# Patient Record
Sex: Male | Born: 1945
Health system: Southern US, Community
[De-identification: ages and names within clinical notes are randomized; demographics above are authoritative.]

## PROBLEM LIST (undated history)

## (undated) DIAGNOSIS — I214 Non-ST elevation (NSTEMI) myocardial infarction: Secondary | ICD-10-CM

## (undated) DIAGNOSIS — Z8619 Personal history of other infectious and parasitic diseases: Secondary | ICD-10-CM

## (undated) DIAGNOSIS — I2510 Atherosclerotic heart disease of native coronary artery without angina pectoris: Secondary | ICD-10-CM

## (undated) DIAGNOSIS — K5792 Diverticulitis of intestine, part unspecified, without perforation or abscess without bleeding: Secondary | ICD-10-CM

## (undated) DIAGNOSIS — K5790 Diverticulosis of intestine, part unspecified, without perforation or abscess without bleeding: Secondary | ICD-10-CM

## (undated) HISTORY — DX: Non-ST elevation (NSTEMI) myocardial infarction: I21.4

## (undated) HISTORY — PX: TONSILLECTOMY: SUR1361

## (undated) HISTORY — DX: Atherosclerotic heart disease of native coronary artery without angina pectoris: I25.10

## (undated) HISTORY — DX: Personal history of other infectious and parasitic diseases: Z86.19

## (undated) HISTORY — PX: HEMORRHOID SURGERY: SHX153

## (undated) HISTORY — PX: CATARACT EXTRACTION: SUR2

## (undated) HISTORY — DX: Diverticulitis of intestine, part unspecified, without perforation or abscess without bleeding: K57.92

---

## 2003-02-27 ENCOUNTER — Ambulatory Visit (HOSPITAL_COMMUNITY): Admission: RE | Admit: 2003-02-27 | Discharge: 2003-02-27 | Payer: Self-pay | Admitting: Cardiology

## 2010-08-28 ENCOUNTER — Ambulatory Visit
Admission: RE | Admit: 2010-08-28 | Discharge: 2010-08-28 | Payer: Self-pay | Source: Home / Self Care | Attending: Internal Medicine | Admitting: Internal Medicine

## 2011-01-02 NOTE — Cardiovascular Report (Signed)
Luke Herrera, PYON NO.:  [id number]   MEDICAL RECORD NO.:  [id number]                   PATIENT TYPE:  OIB   LOCATION:  2870                                 FACILITY:  [facility]   PHYSICIAN:  Arturo Morton. Riley Kill, M.D.             DATE OF BIRTH:  09/24/1945   DATE OF PROCEDURE:  02/27/2003  DATE OF DISCHARGE:                              CARDIAC CATHETERIZATION   INDICATIONS:  Mr. Lemoine is a pleasant 65 year old who has had some  recurrent episodes of chest discomfort and some decline in exercise  tolerance.  The current study is done to assess coronary anatomy.   PROCEDURES:  1. Left heart catheterization.  2. Selective coronary arteriography.  3. Selective left ventriculography.  4. Intercoronary administration of nitroglycerin.  5. Aortic root aortography.   DESCRIPTION OF PROCEDURE:  The patient was brought to the catheterization  lab and prepped and draped in the usual fashion.  Through an anterior  puncture, the right femoral artery was easily entered and a 6-French sheath  was placed.  Views of the left and right coronary arteries were obtained  with multiple angiographic projections.  There appeared to be some spasm in  the right coronary artery.  We gave nitroglycerin just outside the right  coronary ostium.  A narrowing in the vessel just after the RV branch was  completely relieved with nitroglycerin; however, there was still some  damping at the ostium.  We, therefore, did an aortic root shot, which  demonstrated what appeared to be a widely patent aortic root orifice for the  right coronary.  All catheters were subsequently removed and the patient  taken to the holding area in satisfactory clinical condition.   HEMODYNAMIC DATA:  1. The central aorta 130/68, mean 93.  2. Left ventricle 130/13.  3. No aortic or left ventricular gradient on pullback across the aortic     valve.   ANGIOGRAPHIC DATA:  1. Ventriculography was  performed in the RAO projection.  Overall systolic     function was well preserved and no segmental abnormalities or     contractions were identified.  2. The left main coronary was free of critical disease.  3. The left anterior descending appeared to have some mild luminal     irregularity after the takeoff of the major diagonal.  This did not     appear to exceed 20-30% luminal reduction.  Mid and distal LAD were     widely patent as was the diagonal.  4. The circumflex provides two marginal branches, both of which appear to be     free of critical disease.  5. The right coronary artery did demonstrate some coronary spasm as noted     just after the conus branch.  However, this was completely relieved.  The     distal vessel consists of a tiny posterior descending and posterior     lateral system and the  distal right coronary circulation was really     codominant with the circumflex system.   CONCLUSIONS:  1. Preserved overall left ventricular function.  2. Mild luminal irregularity left anterior descending as noted above.  3. Mild coronary spasm of the right coronary artery ostium relieved with     intracoronary nitroglycerin.   DISPOSITION:  1. Continued followup with Jonelle Sidle, M.D., and Dr. Doyne Keel.  2. Check D-dimer.  3. Further workup as indicated.                                               Arturo Morton. Riley Kill, M.D.    TDS/MEDQ  D:  02/27/2003  T:  02/27/2003  Job:  [id number]  Jonelle Sidle, M.D. [facility]   [name] Beck, M.D.   cc:   Jonelle Sidle, M.D. [facility]   [name] Beck, M.D.

## 2011-07-02 ENCOUNTER — Telehealth (INDEPENDENT_AMBULATORY_CARE_PROVIDER_SITE_OTHER): Payer: Self-pay | Admitting: Internal Medicine

## 2011-07-02 NOTE — Telephone Encounter (Signed)
It sounds like he is having a diverticular flare. His last flare was last year. He underwent a colonoscopy which revealed focal colitis involving the sigmoid colon and rectum.  and Multiple diverticula at the sigmoid colon. Biopsy revealed acute colitis, non specific.  Recommendations: Will call in an Rx for Cipro 500mg  BID x 10 days and Flagyl 500mg  po TID x 10 with one refill.   I advised him if pain worsened to call our office. We close at 12 noon on Friday. If he begans to have rectal bleeding, ne should go to the emergency dept.    He will have an office visit with me next week.   Lupita Leash:  He needs an OV with me next week.

## 2011-07-02 NOTE — Telephone Encounter (Signed)
Scheduled a 1 week f/u apt for Diverticulitis on 07/08/11 at 10:30 am.

## 2011-07-02 NOTE — Telephone Encounter (Signed)
Having trouble with his Diverticulitis. Side and stomach is hurting, trouble going to the bathroom and bleeding right smart. Pain level varies. Please return the call to [phone] or [phone].

## 2011-07-08 ENCOUNTER — Ambulatory Visit (INDEPENDENT_AMBULATORY_CARE_PROVIDER_SITE_OTHER): Payer: Medicare Other | Admitting: Internal Medicine

## 2011-07-08 ENCOUNTER — Encounter (INDEPENDENT_AMBULATORY_CARE_PROVIDER_SITE_OTHER): Payer: Self-pay | Admitting: Internal Medicine

## 2011-07-08 VITALS — BP 90/64 | HR 76 | Temp 98.1°F | Ht 72.0 in | Wt 199.4 lb

## 2011-07-08 DIAGNOSIS — K5792 Diverticulitis of intestine, part unspecified, without perforation or abscess without bleeding: Secondary | ICD-10-CM | POA: Insufficient documentation

## 2011-07-08 DIAGNOSIS — K5732 Diverticulitis of large intestine without perforation or abscess without bleeding: Secondary | ICD-10-CM

## 2011-07-08 NOTE — Patient Instructions (Signed)
Progress report once you have finished antibiotics x 14 days.  If pain worsens he was advised to go to the emergency room

## 2011-07-08 NOTE — Progress Notes (Signed)
Subjective:     Patient ID: Luke Herrera, male   DOB: 24-Sep-1945, 65 y.o.   MRN: [id number]  HPI  Luke Herrera is a 65 yr old male here to today.  He called our office last week stating he felt he was having a diverticular flare.  He tells me he doing pretty good. He is not having any rectal bleeding. He still has slight left lower quadrant pain.  His appetite is good.  He has been eating soft foods.  Colonoscopy 03/31/2010:Multiple diverticula at the sigmoid colon. Focal colitis involving sigmoid colon and rectum with pseudomembrane suspicious for C. Difficile colitis. Small external hemorrhoids.  Biopsy:Acute colitis, non specific. CT abdomen/pelvis with CM 02/10/2010: Findings consistent with left sided diverticulosis and diverticulitis. No evidence of perforation, abscess, or free air. Lumbar degenerative disc disease.  Review of Systems see hpi Current Outpatient Prescriptions  Medication Sig Dispense Refill  . aspirin 325 MG tablet Take 325 mg by mouth daily.         Past Surgical History  Procedure Date  . Tonsillectomy   . Hemorrhoid surgery    Past Medical History  Diagnosis Date  . Diverticulitis    History   Social History Narrative  . No narrative on file   Family Status  Relation Status Death Age  . Mother Alive     age 66  . Father Deceased     murdered  . Brother Alive     health unknown   Allergies no known allergies     Objective:   Physical Exam Filed Vitals:   07/08/11 1041  BP: 90/64  Pulse: 76  Temp: 98.1 F (36.7 C)  Height: 6' (1.829 m)  Weight: 199 lb 6.4 oz (90.447 kg)    Alert and oriented. Skin warm and dry. Oral mucosa is moist. Natural teeth in good condition. Sclera anicteric, conjunctivae is pink. Thyroid not enlarged. No cervical lymphadenopathy. Lungs clear. Heart regular rate and rhythm.  Abdomen is soft. Bowel sounds are positive. No hepatomegaly. No abdominal masses felt. No tenderness.  No edema to lower extremities. Patient is  alert and oriented.      Assessment:     Diverticulitis.  He feels much better today. He has some left lower quadrant tenderness.      Plan:     Continue Cipro and Flagyl for four more days (14 days).  Progress report when you have finished antibiotics. If symptoms worsen, go to the ED.

## 2011-07-20 ENCOUNTER — Telehealth (INDEPENDENT_AMBULATORY_CARE_PROVIDER_SITE_OTHER): Payer: Self-pay | Admitting: *Deleted

## 2011-07-20 NOTE — Telephone Encounter (Signed)
Just called to let Luke Herrera know he is doing good.

## 2011-08-24 ENCOUNTER — Telehealth (INDEPENDENT_AMBULATORY_CARE_PROVIDER_SITE_OTHER): Payer: Self-pay | Admitting: *Deleted

## 2011-08-24 NOTE — Telephone Encounter (Signed)
LM asking Terri to please give him a call. The return phone number is [phone].

## 2011-08-24 NOTE — Telephone Encounter (Signed)
Message left at home for patient to return call.  

## 2011-09-01 ENCOUNTER — Encounter (INDEPENDENT_AMBULATORY_CARE_PROVIDER_SITE_OTHER): Payer: Self-pay

## 2011-09-01 ENCOUNTER — Encounter (INDEPENDENT_AMBULATORY_CARE_PROVIDER_SITE_OTHER): Payer: Self-pay | Admitting: Internal Medicine

## 2011-09-01 ENCOUNTER — Ambulatory Visit (INDEPENDENT_AMBULATORY_CARE_PROVIDER_SITE_OTHER): Payer: Medicare Other | Admitting: Internal Medicine

## 2011-09-01 DIAGNOSIS — K625 Hemorrhage of anus and rectum: Secondary | ICD-10-CM | POA: Diagnosis not present

## 2011-09-01 DIAGNOSIS — K5730 Diverticulosis of large intestine without perforation or abscess without bleeding: Secondary | ICD-10-CM | POA: Diagnosis not present

## 2011-09-01 DIAGNOSIS — K5790 Diverticulosis of intestine, part unspecified, without perforation or abscess without bleeding: Secondary | ICD-10-CM | POA: Insufficient documentation

## 2011-09-01 MED ORDER — CIPROFLOXACIN HCL 500 MG PO TABS: 500.0000 mg | ORAL_TABLET | Freq: Two times a day (BID) | ORAL | Status: AC

## 2011-09-01 NOTE — Progress Notes (Addendum)
Subjective:     Patient ID: Luke Herrera, male   DOB: 1946-06-27, 66 y.o.   MRN: [id number]  HPI  Luke Herrera is a 66 yr old male with c/o that his rectum is raw and he has bleeding.  Symptoms started Friday. He has tried Preparation H but has not helped.  He says the bleeding is a fair amount.  He denies abdominal pain. His appetite is good.   Colonoscopy 03/31/2010: Multiple diverticula at the sigmoid colon. Focal colitis involving sigmoid colon and rectum with pseudomembranous for C. Difficile. Small external hemorrhoids. 02/10/2010 CT abdomen/pelvis with CM: Findings consistent with left sided diverticulosis and diverticulitis. No evidence of perforation, abscess, or free air. Lumbar degenerative disc disease.  Review of Systems     Objective:   Physical ExamAlert and oriented. Skin warm and dry. Oral mucosa is moist.   . Sclera anicteric, conjunctivae is pink. Thyroid not enlarged. No cervical lymphadenopathy. Lungs clear. Heart regular rate and rhythm.  Abdomen is soft. Bowel sounds are positive. No hepatomegaly. No abdominal masses felt. No tenderness. Rectal exam: very tender for the patient. Stool was positive for blood.   No edema to lower extremities. Patient is alert and oriented.     Assessment:    Rectal bleeding? Questionable rectal fissure.   I discussed this case with Dr. Karilyn Herrera   Plan:    Will try Luke Herrera on  Nitroglycerin oint 0.2%, and Cipro 500mg  BID x 7 days. PR in 3 days. OV in 6 months

## 2011-09-01 NOTE — Patient Instructions (Signed)
PR in 3 days.  Possible sigmoid if not better

## 2011-09-08 ENCOUNTER — Telehealth (INDEPENDENT_AMBULATORY_CARE_PROVIDER_SITE_OTHER): Payer: Self-pay | Admitting: *Deleted

## 2011-09-08 NOTE — Telephone Encounter (Signed)
LM for Luke Herrera - he is some better than he was before he seen Terri. Patient's return phone number is [phone].

## 2011-09-08 NOTE — Telephone Encounter (Signed)
Message left at home to return call.

## 2011-09-11 NOTE — Telephone Encounter (Signed)
Patient came by office and sated he felt better per Lupita Leash.

## 2011-10-14 ENCOUNTER — Encounter (INDEPENDENT_AMBULATORY_CARE_PROVIDER_SITE_OTHER): Payer: Self-pay | Admitting: Internal Medicine

## 2011-10-14 ENCOUNTER — Other Ambulatory Visit (INDEPENDENT_AMBULATORY_CARE_PROVIDER_SITE_OTHER): Payer: Self-pay | Admitting: *Deleted

## 2011-10-14 ENCOUNTER — Ambulatory Visit (INDEPENDENT_AMBULATORY_CARE_PROVIDER_SITE_OTHER): Payer: Medicare Other | Admitting: Internal Medicine

## 2011-10-14 ENCOUNTER — Encounter (INDEPENDENT_AMBULATORY_CARE_PROVIDER_SITE_OTHER): Payer: Self-pay | Admitting: *Deleted

## 2011-10-14 DIAGNOSIS — K6289 Other specified diseases of anus and rectum: Secondary | ICD-10-CM

## 2011-10-14 DIAGNOSIS — K5289 Other specified noninfective gastroenteritis and colitis: Secondary | ICD-10-CM | POA: Diagnosis not present

## 2011-10-14 DIAGNOSIS — K529 Noninfective gastroenteritis and colitis, unspecified: Secondary | ICD-10-CM

## 2011-10-14 NOTE — Patient Instructions (Signed)
Colonoscopy with Dr. Rehman 

## 2011-10-14 NOTE — Progress Notes (Signed)
Subjective:     Patient ID: Luke Herrera, male   DOB: [birth year], 66 y.o.   MRN: [id number]  HPI Luke Herrera is a 66 yr old male with c/o left lower quadrant pain. He says when he has a BM, it burns like fire.  This has been occuring for a year and a half off and on.  Stools are soft.  He has a burning sensation in his rectum with his BM.  He does see blood on occasion. Appetite is good. No weight loss. He has 3-4 BMs daily.   He was seen in January of this year for this same pain and given an Rx for NTG ointment.  Symptoms did not get any better evidently.   Colonoscopy 03/31/2010: Multiple diverticula at the sigmoid colon. Focal colitis involving sigmoid colon and rectum with pseudomembranous for C. Difficile. Biopsy: acute colitis, non specific. Small external hemorrhoids.  02/10/2010 CT abdomen/pelvis with CM: Findings consistent with left sided diverticulosis and diverticulitis. No evidence of perforation, abscess, or free air. Lumbar degenerative disc disease.      Review of Systems see hpi Current Outpatient Prescriptions  Medication Sig Dispense Refill  . aspirin 325 MG tablet Take 325 mg by mouth 2 (two) times a week.       . Flaxseed, Linseed, (FLAX SEED OIL PO) Take by mouth.      . OIL OF OREGANO PO Take 2 drops by mouth once.      . Probiotic Product (PROBIOTIC ACIDOPHILUS) CAPS Take 1 tablet by mouth 1 day or 1 dose.      Jennette Banker Sodium 30-100 MG CAPS Take by mouth.       Past Medical History  Diagnosis Date  . Diverticulitis    Past Surgical History  Procedure Date  . Tonsillectomy   . Hemorrhoid surgery    Family Status  Relation Status Death Age  . Mother Alive     age 19  . Father Deceased     murdered  . Brother Alive     health unknown   History   Social History  . Marital Status: Single    Spouse Name: N/A    Number of Children: N/A  . Years of Education: N/A   Occupational History  . Not on file.   Social History Main Topics  .  Smoking status: Never Smoker   . Smokeless tobacco: Not on file  . Alcohol Use: No  . Drug Use: No  . Sexually Active: Not on file   Other Topics Concern  . Not on file   Social History Narrative  . No narrative on file   No Known Allergies      Objective:   Physical Exam Filed Vitals:   10/14/11 1024  BP: 112/60  Pulse: 72  Temp: 98.1 F (36.7 C)  Height: 5\' 11"  (1.803 m)  Weight: 191 lb 14.4 oz (87.045 kg)    Alert and oriented. Skin warm and dry. Oral mucosa is moist.   . Sclera anicteric, conjunctivae is pink. Thyroid not enlarged. No cervical lymphadenopathy. Lungs clear. Heart regular rate and rhythm.  Abdomen is soft. Bowel sounds are positive. No hepatomegaly. No abdominal masses felt. No tenderness.  No edema to lower extremities. Patient is alert and oriented.      Assessment:   Rectal pain, colitis, rt lower quadrant pain. Hx of diverticulitis Colonoscopy 03/31/2010: Multiple diverticula at the sigmoid colon. Focal colitis involving sigmoid colon and rectum with pseudomembranous for C. Difficile. Small external  hemorrhoids.  02/10/2010 CT abdomen/pelvis with CM: Findings consistent with left sided diverticulosis and diverticulitis. No evidence of perforation, abscess, or free air. Lumbar degenerative disc disease.       Plan:    Flexible sigmoid. I discussed this case with Dr. Karilyn Cota.

## 2011-10-15 MED ORDER — SODIUM CHLORIDE 0.45 % IV SOLN
Freq: Once | INTRAVENOUS | Status: AC
  Administered 2011-10-16: 07:00:00 via INTRAVENOUS

## 2011-10-16 ENCOUNTER — Encounter (HOSPITAL_COMMUNITY)
Admission: RE | Disposition: A | Discharge status: Home / Self Care | Payer: Self-pay | Source: Ambulatory Visit | Attending: Internal Medicine

## 2011-10-16 ENCOUNTER — Encounter (HOSPITAL_COMMUNITY): Payer: Self-pay

## 2011-10-16 ENCOUNTER — Ambulatory Visit (HOSPITAL_COMMUNITY)
Admission: RE | Admit: 2011-10-16 | Discharge: 2011-10-16 | Disposition: A | Discharge status: Home / Self Care | Payer: Medicare Other | Source: Ambulatory Visit | Attending: Internal Medicine | Admitting: Internal Medicine

## 2011-10-16 DIAGNOSIS — R1032 Left lower quadrant pain: Secondary | ICD-10-CM

## 2011-10-16 DIAGNOSIS — R198 Other specified symptoms and signs involving the digestive system and abdomen: Secondary | ICD-10-CM

## 2011-10-16 DIAGNOSIS — K602 Anal fissure, unspecified: Secondary | ICD-10-CM

## 2011-10-16 DIAGNOSIS — K529 Noninfective gastroenteritis and colitis, unspecified: Secondary | ICD-10-CM

## 2011-10-16 DIAGNOSIS — K626 Ulcer of anus and rectum: Secondary | ICD-10-CM | POA: Diagnosis not present

## 2011-10-16 DIAGNOSIS — Z8719 Personal history of other diseases of the digestive system: Secondary | ICD-10-CM

## 2011-10-16 DIAGNOSIS — K6289 Other specified diseases of anus and rectum: Secondary | ICD-10-CM

## 2011-10-16 DIAGNOSIS — K5730 Diverticulosis of large intestine without perforation or abscess without bleeding: Secondary | ICD-10-CM | POA: Insufficient documentation

## 2011-10-16 HISTORY — PX: FLEXIBLE SIGMOIDOSCOPY: SHX5431

## 2011-10-16 HISTORY — DX: Diverticulosis of intestine, part unspecified, without perforation or abscess without bleeding: K57.90

## 2011-10-16 SURGERY — SIGMOIDOSCOPY, FLEXIBLE
Anesthesia: Moderate Sedation

## 2011-10-16 MED ORDER — MEPERIDINE HCL 25 MG/ML IJ SOLN
INTRAMUSCULAR | Status: DC | PRN
  Administered 2011-10-16 (×2): 25 mg via INTRAVENOUS

## 2011-10-16 MED ORDER — MIDAZOLAM HCL 5 MG/5ML IJ SOLN
INTRAMUSCULAR | Status: AC
  Filled 2011-10-16: qty 10

## 2011-10-16 MED ORDER — MIDAZOLAM HCL 5 MG/5ML IJ SOLN
INTRAMUSCULAR | Status: DC | PRN
  Administered 2011-10-16 (×2): 2 mg via INTRAVENOUS

## 2011-10-16 MED ORDER — NITROGLYCERIN 0.4 % RE OINT: 1.0000 "application " | TOPICAL_OINTMENT | Freq: Two times a day (BID) | RECTAL | Status: DC

## 2011-10-16 MED ORDER — STERILE WATER FOR IRRIGATION IR SOLN
Status: DC | PRN
  Administered 2011-10-16: 07:00:00

## 2011-10-16 MED ORDER — MEPERIDINE HCL 50 MG/ML IJ SOLN
INTRAMUSCULAR | Status: AC
  Filled 2011-10-16: qty 1

## 2011-10-16 NOTE — Op Note (Signed)
FLEXIBLE SIGMOIDOSCOPY  PROCEDURE REPORT  PATIENT:  Luke Herrera  MR#:  [id number] Birthdate:  July 26, 1946, 66 y.o., male Endoscopist:  Dr. Malissa Hippo, MD Referred By:  Ms. Bennie Pierini, NP  Procedure Date: 10/16/2011  Procedure:  Flexible sigmoidoscopy.  Indications:  Patient is 66 year old Caucasian male with a recurrent LLQ abdominal pain as well as burning rectal pain with defecation. He is undergoing diagnostic flexible sigmoidoscopy. He has history of focal colitis as noted in history and physical.  Informed Consent:   Procedure and risks were reviewed with the patient and informed consent was obtained.  Medications:  Demerol 50 mg IV Versed 4 mg IV  Description of procedure:  After a digital rectal exam was performed, that colonoscope was advanced from the anus through the rectum and advanced into the splenic flexure where he  had formed stool. As the scope was gradually withdrawn the mucosa was carefully examined. Rectal mucosa was also examined as a last retroflex exam was performed. Findings:   Digital rectal exam was very painful with increased tone to anal sphincter. Moderate number of diverticula in sigmoid colon with few at descending colon. Erythematous nodular area next to the dentate line. It was biopsied for histology. Linear ulcer noted involving proximal aspect of anal canal.   Therapeutic/Diagnostic Maneuvers Performed:  See above.  Complications:  None    Impression:  Examination performed to splenic flexure. Moderate number of diverticula at the sigmoid colon and few at descending colon. No evidence of endoscopic colitis. Focal nodular area of distal rectum biopsy for routine histology. Patient has anal fissure.   Recommendations:  Nitroglycerin ointment 0.2% topical application of anal canal twice a day. Office visit in 4 weeks. I would be contacting patient with the result of biopsy. If rectal symptoms do not resolve with  nitroglycerin ointment he will need internal sphincterotomy.  Curtistine Pettitt U  10/16/2011 7:59 AM  CC: Dr. Bennie Pierini, FNP, FNP & Dr. Bonnetta Barry ref. provider found

## 2011-10-16 NOTE — Discharge Instructions (Signed)
Resume usual medications and high fiber diet. Nitroglycerin 0.4% lidocaine canal twice daily. No driving for [id number]. Physician we'll contact you with the biopsy results. Office visit with Dr. Karilyn Cota in 4 weeksDiverticulosis Diverticulosis is a common condition that develops when small pouches (diverticula) form in the wall of the colon. The risk of diverticulosis increases with age. It happens more often in people who eat a low-fiber diet. Most individuals with diverticulosis have no symptoms. Those individuals with symptoms usually experience abdominal pain, constipation, or loose stools (diarrhea). HOME CARE INSTRUCTIONS   Increase the amount of fiber in your diet as directed by your caregiver or dietician. This may reduce symptoms of diverticulosis.   Your caregiver may recommend taking a dietary fiber supplement.   Drink at least 6 to 8 glasses of water each day to prevent constipation.   Try not to strain when you have a bowel movement.   Your caregiver may recommend avoiding nuts and seeds to prevent complications, although this is still an uncertain benefit.   Only take over-the-counter or prescription medicines for pain, discomfort, or fever as directed by your caregiver.  FOODS WITH HIGH FIBER CONTENT INCLUDE:  Fruits. Apple, peach, pear, tangerine, raisins, prunes.   Vegetables. Brussels sprouts, asparagus, broccoli, cabbage, carrot, cauliflower, romaine lettuce, spinach, summer squash, tomato, winter squash, zucchini.   Starchy Vegetables. Baked beans, kidney beans, lima beans, split peas, lentils, potatoes (with skin).   Grains. Whole wheat bread, brown rice, bran flake cereal, plain oatmeal, white rice, shredded wheat, bran muffins.  SEEK IMMEDIATE MEDICAL CARE IF:   You develop increasing pain or severe bloating.   You have an oral temperature above 102 F (38.9 C), not controlled by medicine.   You develop vomiting or bowel movements that are bloody or black.    Document Released: 04/30/2004 Document Revised: 04/15/2011 Document Reviewed: 01/01/2010 [facility] Patient Information 2012 [place], [place].High Fiber Diet A high fiber diet changes your normal diet to include more whole grains, legumes, fruits, and vegetables. Changes in the diet involve replacing refined carbohydrates with unrefined foods. The calorie level of the diet is essentially unchanged. The Dietary Reference Intake (recommended amount) for adult males is 38 g per day. For adult females, it is 25 g per day. Pregnant and lactating women should consume 28 g of fiber per day. Fiber is the intact part of a plant that is not broken down during digestion. Functional fiber is fiber that has been isolated from the plant to provide a beneficial effect in the body. PURPOSE  Increase stool bulk.   Ease and regulate bowel movements.   Lower cholesterol.  INDICATIONS THAT YOU NEED MORE FIBER  Constipation and hemorrhoids.   Uncomplicated diverticulosis (intestine condition) and irritable bowel syndrome.   Weight management.   As a protective measure against hardening of the arteries (atherosclerosis), diabetes, and cancer.  NOTE OF CAUTION If you have a digestive or bowel problem, ask your caregiver for advice before adding high fiber foods to your diet. Some of the following medical problems are such that a high fiber diet should not be used without consulting your caregiver:  Acute diverticulitis (intestine infection).   Partial small bowel obstructions.   Complicated diverticular disease involving bleeding, rupture (perforation), or abscess (boil, furuncle).   Presence of autonomic neuropathy (nerve damage) or gastric paresis (stomach cannot empty itself).  GUIDELINES FOR INCREASING FIBER  Start adding fiber to the diet slowly. A gradual increase of about 5 more grams (2 slices of whole-wheat bread, 2 servings  of most fruits or vegetables, or 1 bowl of high fiber cereal) per day is  best. Too rapid an increase in fiber may result in constipation, flatulence, and bloating.   Drink enough water and fluids to keep your urine clear or pale yellow. Water, juice, or caffeine-free drinks are recommended. Not drinking enough fluid may cause constipation.   Eat a variety of high fiber foods rather than one type of fiber.   Try to increase your intake of fiber through using high fiber foods rather than fiber pills or supplements that contain small amounts of fiber.   The goal is to change the types of food eaten. Do not supplement your present diet with high fiber foods, but replace foods in your present diet.  INCLUDE A VARIETY OF FIBER SOURCES  Replace refined and processed grains with whole grains, canned fruits with fresh fruits, and incorporate other fiber sources. White rice, white breads, and most bakery goods contain little or no fiber.   Brown whole-grain rice, buckwheat oats, and many fruits and vegetables are all good sources of fiber. These include: broccoli, Brussels sprouts, cabbage, cauliflower, beets, sweet potatoes, white potatoes (skin on), carrots, tomatoes, eggplant, squash, berries, fresh fruits, and dried fruits.   Cereals appear to be the richest source of fiber. Cereal fiber is found in whole grains and bran. Bran is the fiber-rich outer coat of cereal grain, which is largely removed in refining. In whole-grain cereals, the bran remains. In breakfast cereals, the largest amount of fiber is found in those with "bran" in their names. The fiber content is sometimes indicated on the label.   You may need to include additional fruits and vegetables each day.   In baking, for 1 cup white flour, you may use the following substitutions:   1 cup whole-wheat flour minus 2 tbs.    cup white flour plus  cup whole-wheat flour.  Document Released: 08/03/2005 Document Revised: 04/15/2011 Document Reviewed: 06/11/2009 [facility] Patient Information 2012 [place],  [place].Colon Polyps A polyp is extra tissue that grows inside your body. Colon polyps grow in the large intestine. The large intestine, also called the colon, is part of your digestive system. It is a long, hollow tube at the end of your digestive tract where your body makes and stores stool. Most polyps are not dangerous. They are benign. This means they are not cancerous. But over time, some types of polyps can turn into cancer. Polyps that are smaller than a pea are usually not harmful. But larger polyps could someday become or may already be cancerous. To be safe, doctors remove all polyps and test them.  WHO GETS POLYPS? Anyone can get polyps, but certain people are more likely than others. You may have a greater chance of getting polyps if:  You are over 50.   You have had polyps before.   Someone in your family has had polyps.   Someone in your family has had cancer of the large intestine.   Find out if someone in your family has had polyps. You may also be more likely to get polyps if you:   Eat a lot of fatty foods.   Smoke.   Drink alcohol.   Do not exercise.   Eat too much.  SYMPTOMS  Most small polyps do not cause symptoms. People often do not know they have one until their caregiver finds it during a regular checkup or while testing them for something else. Some people do have symptoms like these:  Bleeding from the anus. You might notice blood on your underwear or on toilet paper after you have had a bowel movement.   Constipation or diarrhea that lasts more than a week.   Blood in the stool. Blood can make stool look black or it can show up as red streaks in the stool.  If you have any of these symptoms, see your caregiver. HOW DOES THE DOCTOR TEST FOR POLYPS? The doctor can use four tests to check for polyps:  Digital rectal exam. The caregiver wears gloves and checks your rectum (the last part of the large intestine) to see if it feels normal. This test would find  polyps only in the rectum. Your caregiver may need to do one of the other tests listed below to find polyps higher up in the intestine.   Barium enema. The caregiver puts a liquid called barium into your rectum before taking x-rays of your large intestine. Barium makes your intestine look white in the pictures. Polyps are dark, so they are easy to see.   Sigmoidoscopy. With this test, the caregiver can see inside your large intestine. A thin flexible tube is placed into your rectum. The device is called a sigmoidoscope, which has a light and a tiny video camera in it. The caregiver uses the sigmoidoscope to look at the last third of your large intestine.   Colonoscopy. This test is like sigmoidoscopy, but the caregiver looks at all of the large intestine. It usually requires sedation. This is the most common method for finding and removing polyps.  TREATMENT   The caregiver will remove the polyp during sigmoidoscopy or colonoscopy. The polyp is then tested for cancer.   If you have had polyps, your caregiver may want you to get tested regularly in the future.  PREVENTION  There is not one sure way to prevent polyps. You might be able to lower your risk of getting them if you:  Eat more fruits and vegetables and less fatty food.   Do not smoke.   Avoid alcohol.   Exercise every day.   Lose weight if you are overweight.   Eating more calcium and folate can also lower your risk of getting polyps. Some foods that are rich in calcium are milk, cheese, and broccoli. Some foods that are rich in folate are chickpeas, kidney beans, and spinach.   Aspirin might help prevent polyps. Studies are under way.  Document Released: 04/29/2004 Document Revised: 04/15/2011 Document Reviewed: 10/05/2007 [facility] Patient Information 2012 [place], [place].

## 2011-10-16 NOTE — H&P (Signed)
This is an update to history and physical from 10/14/2011. There has been no interval change in patient's condition or medications. He is undergoing diagnostic flexible sigmoidoscopy

## 2011-10-21 ENCOUNTER — Encounter (HOSPITAL_COMMUNITY): Payer: Self-pay | Admitting: Internal Medicine

## 2011-10-21 ENCOUNTER — Encounter (INDEPENDENT_AMBULATORY_CARE_PROVIDER_SITE_OTHER): Payer: Self-pay | Admitting: *Deleted

## 2011-11-09 ENCOUNTER — Ambulatory Visit (INDEPENDENT_AMBULATORY_CARE_PROVIDER_SITE_OTHER): Payer: Medicare Other | Admitting: Internal Medicine

## 2011-11-09 ENCOUNTER — Encounter (INDEPENDENT_AMBULATORY_CARE_PROVIDER_SITE_OTHER): Payer: Self-pay | Admitting: Internal Medicine

## 2011-11-09 VITALS — BP 130/70 | HR 76 | Temp 98.0°F | Resp 18 | Ht 71.0 in | Wt 193.9 lb

## 2011-11-09 DIAGNOSIS — K602 Anal fissure, unspecified: Secondary | ICD-10-CM

## 2011-11-09 HISTORY — DX: Anal fissure, unspecified: K60.2

## 2011-11-09 MED ORDER — NITROGLYCERIN 0.4 % RE OINT: 1.0000 "application " | TOPICAL_OINTMENT | Freq: Two times a day (BID) | RECTAL | Status: DC

## 2011-11-09 MED ORDER — SENNOSIDES-DOCUSATE SODIUM 8.6-50 MG PO TABS: 1.0000 | ORAL_TABLET | Freq: Two times a day (BID) | ORAL | Status: AC

## 2011-11-09 NOTE — Progress Notes (Signed)
Presenting complaint;  Followup for anal fissure.  Subjective:  Luke Herrera is 66 year old Caucasian male who underwent flexible sigmoidoscopy on 10/16/2011 because of rectal burning and painful defecation. He had left-sided colonic diverticulosis, anal fissure and nodularity just above the dentate line. This area was biopsied and revealed surface erosion or ulceration with lymphoid aggregates. Patient was begun on nitroglycerin ointment. He may have been using 0.4%. He says it helped with her symptoms but he's been having headache. He hasn't used it for the last few days. He is having intermittent hematochezia. Having painful defecation but nothing like before. His stools are soft but he has to strain to initiate defecation. His appetite is good. He denies nausea or vomiting.  Current Medications: Current Outpatient Prescriptions  Medication Sig Dispense Refill  . aspirin 325 MG tablet Take 325 mg by mouth 2 (two) times a week.       . Flaxseed, Linseed, (FLAX SEED OIL PO) Take by mouth.      . Probiotic Product (PROBIOTIC ACIDOPHILUS) CAPS Take 1 tablet by mouth 1 day or 1 dose.      Marland Kitchen Zinc 100 MG TABS Take by mouth daily.      . Nitroglycerin 0.4 % OINT Place 1 application rectally 2 (two) times daily.  1 Tube  2     Objective: Blood pressure 130/70, pulse 76, temperature 98 F (36.7 C), temperature source Oral, resp. rate 18, height 5\' 11"  (1.803 m), weight 193 lb 14.4 oz (87.952 kg). Patient does not appear to be in any distress. Abdomen is symmetrical and soft. Mild tenderness noted in hypogastric region. No organomegaly or masses. Rectal examination Limited to external inspection and palpation no nodularity or tenderness noted.  Assessment:  Fissure in ano. Partial response to topical therapy with Nitro ointment which she has been using intermittently because of headache. He may want to decrease the dose his wife Luke Herrera(RN) Will make sure that it is 0.2%  ointment.   Plan:  Peri-Colace one tablet by mouth twice a day. Use nitroglycerin ointment at least once a day. Progress report in 4 weeks. If his symptoms are not fully resolved need examination under anesthesia and possible internal sphincterotomy. Since he has had hemorrhoidectomy x2, I would recommend referral to your Durenda H[facility].

## 2011-11-09 NOTE — Patient Instructions (Signed)
Take Peri-Colace 1 tablet twice daily. Continue nitroglycerin ointment at least once daily as directed Progress report in 4 weeks

## 2011-11-27 DIAGNOSIS — S61209A Unspecified open wound of unspecified finger without damage to nail, initial encounter: Secondary | ICD-10-CM | POA: Diagnosis not present

## 2011-11-27 DIAGNOSIS — Z23 Encounter for immunization: Secondary | ICD-10-CM | POA: Diagnosis not present

## 2011-12-09 ENCOUNTER — Telehealth (INDEPENDENT_AMBULATORY_CARE_PROVIDER_SITE_OTHER): Payer: Self-pay | Admitting: *Deleted

## 2011-12-09 NOTE — Telephone Encounter (Signed)
We need to refer him to Dr.Greg waters for surgical consult.

## 2011-12-09 NOTE — Telephone Encounter (Signed)
Per Dr. Karilyn Cota refer the patient to Dr. Byrd Hesselbach at [facility] ,call Marylu Lund and get things rolling.Patient called and messages left with Dr.Rehman's recommendations. Explained that we would make a contact , fax all records that are requested and Marilynne Drivers will call with Mr. Bethel appointment. Forwarded to [place] to arrange.

## 2011-12-09 NOTE — Telephone Encounter (Signed)
Patient called and message left on his answering service that we are in the process of making appointment with Dr. Mirian Mo.

## 2011-12-09 NOTE — Telephone Encounter (Signed)
Patient's wife had left a message on 12/08/11 , they were to call in 4 weeks with a progress report, he is no better still having a lot of rectal pain and some bleeding. Stated that Dr.Rehman had mentioned a referral to Dr.Waters at [facility]. The nitroglycerin ointment has not helped. Today they did call back to ask what did Dr.Rehman want to do. I told them I would send a message and also call him. Per Dr.Rehman's office note: If no better need exam under anesthesia and possible internal sphincterotomy or refer to Dr. Durenda Hurt at [facility] to Dr. Karilyn Cota to address. Patient's home number is [phone] or his cell number is [phone].

## 2011-12-10 NOTE — Telephone Encounter (Signed)
appt w/ Dr Emeterio Reeve 12/31/11 @ 130 (1:15), patient aware of appt, notes have been faxed to [phone] attention: medical records

## 2011-12-31 DIAGNOSIS — K602 Anal fissure, unspecified: Secondary | ICD-10-CM | POA: Diagnosis not present

## 2012-01-28 DIAGNOSIS — K602 Anal fissure, unspecified: Secondary | ICD-10-CM | POA: Diagnosis not present

## 2012-02-29 ENCOUNTER — Ambulatory Visit (INDEPENDENT_AMBULATORY_CARE_PROVIDER_SITE_OTHER): Payer: Medicare Other | Admitting: Internal Medicine

## 2012-03-10 DIAGNOSIS — K602 Anal fissure, unspecified: Secondary | ICD-10-CM | POA: Diagnosis not present

## 2012-03-16 DIAGNOSIS — L259 Unspecified contact dermatitis, unspecified cause: Secondary | ICD-10-CM | POA: Diagnosis not present

## 2012-03-16 DIAGNOSIS — L820 Inflamed seborrheic keratosis: Secondary | ICD-10-CM | POA: Diagnosis not present

## 2012-03-16 DIAGNOSIS — D485 Neoplasm of uncertain behavior of skin: Secondary | ICD-10-CM | POA: Diagnosis not present

## 2013-06-22 DIAGNOSIS — S4980XA Other specified injuries of shoulder and upper arm, unspecified arm, initial encounter: Secondary | ICD-10-CM | POA: Diagnosis not present

## 2013-06-22 DIAGNOSIS — M25519 Pain in unspecified shoulder: Secondary | ICD-10-CM | POA: Diagnosis not present

## 2013-07-18 ENCOUNTER — Encounter (INDEPENDENT_AMBULATORY_CARE_PROVIDER_SITE_OTHER): Payer: Self-pay | Admitting: Internal Medicine

## 2013-07-18 ENCOUNTER — Ambulatory Visit (INDEPENDENT_AMBULATORY_CARE_PROVIDER_SITE_OTHER): Payer: Medicare Other | Admitting: Internal Medicine

## 2013-07-18 VITALS — BP 138/52 | HR 60 | Temp 98.0°F | Ht 71.0 in | Wt 202.6 lb

## 2013-07-18 DIAGNOSIS — K6289 Other specified diseases of anus and rectum: Secondary | ICD-10-CM

## 2013-07-18 DIAGNOSIS — K5900 Constipation, unspecified: Secondary | ICD-10-CM | POA: Diagnosis not present

## 2013-07-18 DIAGNOSIS — K602 Anal fissure, unspecified: Secondary | ICD-10-CM | POA: Diagnosis not present

## 2013-07-18 MED ORDER — DILTIAZEM GEL 2 %: 1.0000 "application " | Freq: Three times a day (TID) | CUTANEOUS | Status: DC

## 2013-07-18 NOTE — Patient Instructions (Addendum)
Diltiazem oint TID.  Linzess daily ( Samples x 4 boxes. PR in 2 weeks. Samples of Dexilant x 3 boxes given to patient.

## 2013-07-18 NOTE — Progress Notes (Signed)
Subjective:     Patient ID: Luke Herrera, male   DOB: 1946-07-22, 67 y.o.   MRN: [id number]  HPI  10/16/2011 he underwent a flexible sigmoidoscopy for rectal burning and painful defecation. He had left-sided colonic diverticulosis, anal fissure and nodularity just above the dentate line. He was given NTG oint 0.2% oint to use at least once a day.  Hx of hemorrhoidectomy.  He has seen Dr. Byrd Hesselbach in [facility].  Given an Rx for Diltiazem 2% gel TID. This medication worked. Today he tells me he has "bad" constipation. He will have rectal bleeding. His stools are very hard. He ha a lot of indigestion. Indigestion worse at night. He takes TUMs on a prn basis.  Appetite is good. No weight loss. He has a BM a couple of times a day. The first stool is hard and the second is soft.  He does have pain when he has a BM.  Colonoscopy 03/31/2010: Multiple diverticula at the sigmoid colon. Focal colitis involving sigmoid colon and rectum with pseudomembranous for C. Difficile. Small external hemorrhoids.  02/10/2010 CT abdomen/pelvis with CM: Findings consistent with left sided diverticulosis and diverticulitis. No evidence of perforation, abscess, or free air. Lumbar degenerative disc disease.  Review of Systems   Review of Systems Current Outpatient Prescriptions  Medication Sig Dispense Refill  . aspirin 325 MG tablet Take 325 mg by mouth 2 (two) times a week.       . docusate sodium (COLACE) 100 MG capsule Take 100 mg by mouth 2 (two) times daily.      . magnesium 30 MG tablet Take 30 mg by mouth 2 (two) times daily.      . Nitroglycerin 0.4 % OINT Place 1 application rectally 2 (two) times daily.  1 Tube  2  . Pomegranate, Punica granatum, 250 MG CAPS Take by mouth.      . Probiotic Product (PROBIOTIC ACIDOPHILUS) CAPS Take 1 tablet by mouth 1 day or 1 dose.      Marland Kitchen Zinc 100 MG TABS Take 50 mg by mouth daily.       Marland Kitchen diltiazem 2 % GEL Apply 1 application topically 3 (three) times daily.  30 g  2    No current facility-administered medications for this visit.   Past Medical History  Diagnosis Date  . Diverticulitis   . Diverticulosis    Past Surgical History  Procedure Laterality Date  . Tonsillectomy    . Hemorrhoid surgery    . Flexible sigmoidoscopy  10/16/2011    Procedure: FLEXIBLE SIGMOIDOSCOPY;  Surgeon: Malissa Hippo, MD;  Location: [facility];  Service: Endoscopy;  Laterality: N/A;  930   No Known Allergies      Objective:   Physical Exam  Filed Vitals:   07/18/13 1442  BP: 138/52  Pulse: 60  Temp: 98 F (36.7 C)  Height: 5\' 11"  (1.803 m)  Weight: 202 lb 9.6 oz (91.899 kg)  Alert and oriented. Skin warm and dry. Oral mucosa is moist.   . Sclera anicteric, conjunctivae is pink. Thyroid not enlarged. No cervical lymphadenopathy. Lungs clear. Heart regular rate and rhythm.  Abdomen is soft. Bowel sounds are positive. No hepatomegaly. No abdominal masses felt. No tenderness.  No edema to lower extremities. Patient is alert and oriented.     Assessment:    constipation which has been chronic. 1st stool of day hard and painful. Hx of anal fissure.He has seen Dr. Dorthula Nettles for this. Given Rx for Diltiazem  0.2% TID which helped.     Plan:   Refill for Diltiazem 0.2% TID. Samples of Linzess x 4 boxes. Samples of Dexilant x 3 boxes given to patient. He will call with a PR in 2 weeks. If not better, will refer to Dr. Byrd Hesselbach for possible internal sphincterotomy.

## 2013-08-07 DIAGNOSIS — K602 Anal fissure, unspecified: Secondary | ICD-10-CM | POA: Diagnosis not present

## 2013-11-01 ENCOUNTER — Other Ambulatory Visit (HOSPITAL_COMMUNITY): Payer: Self-pay | Admitting: Orthopedic Surgery

## 2013-11-01 DIAGNOSIS — M25511 Pain in right shoulder: Secondary | ICD-10-CM

## 2013-11-01 DIAGNOSIS — M75120 Complete rotator cuff tear or rupture of unspecified shoulder, not specified as traumatic: Secondary | ICD-10-CM | POA: Diagnosis not present

## 2013-11-01 DIAGNOSIS — M779 Enthesopathy, unspecified: Secondary | ICD-10-CM | POA: Diagnosis not present

## 2013-11-06 ENCOUNTER — Ambulatory Visit (HOSPITAL_COMMUNITY)
Admission: RE | Admit: 2013-11-06 | Discharge: 2013-11-06 | Disposition: A | Discharge status: Home / Self Care | Payer: Medicare Other | Source: Ambulatory Visit | Attending: Orthopedic Surgery | Admitting: Orthopedic Surgery

## 2013-11-06 DIAGNOSIS — M25519 Pain in unspecified shoulder: Secondary | ICD-10-CM | POA: Insufficient documentation

## 2013-11-06 DIAGNOSIS — M25511 Pain in right shoulder: Secondary | ICD-10-CM

## 2013-11-06 DIAGNOSIS — X58XXXA Exposure to other specified factors, initial encounter: Secondary | ICD-10-CM | POA: Insufficient documentation

## 2013-11-06 DIAGNOSIS — S46819A Strain of other muscles, fascia and tendons at shoulder and upper arm level, unspecified arm, initial encounter: Secondary | ICD-10-CM | POA: Diagnosis not present

## 2013-11-10 DIAGNOSIS — M75120 Complete rotator cuff tear or rupture of unspecified shoulder, not specified as traumatic: Secondary | ICD-10-CM | POA: Diagnosis not present

## 2013-11-10 DIAGNOSIS — M779 Enthesopathy, unspecified: Secondary | ICD-10-CM | POA: Diagnosis not present

## 2013-11-15 DIAGNOSIS — M7500 Adhesive capsulitis of unspecified shoulder: Secondary | ICD-10-CM | POA: Diagnosis not present

## 2013-11-17 DIAGNOSIS — M7500 Adhesive capsulitis of unspecified shoulder: Secondary | ICD-10-CM | POA: Diagnosis not present

## 2013-11-20 DIAGNOSIS — M7500 Adhesive capsulitis of unspecified shoulder: Secondary | ICD-10-CM | POA: Diagnosis not present

## 2013-11-22 DIAGNOSIS — M7500 Adhesive capsulitis of unspecified shoulder: Secondary | ICD-10-CM | POA: Diagnosis not present

## 2013-11-27 DIAGNOSIS — M7500 Adhesive capsulitis of unspecified shoulder: Secondary | ICD-10-CM | POA: Diagnosis not present

## 2013-11-29 DIAGNOSIS — M7500 Adhesive capsulitis of unspecified shoulder: Secondary | ICD-10-CM | POA: Diagnosis not present

## 2013-12-01 DIAGNOSIS — M75120 Complete rotator cuff tear or rupture of unspecified shoulder, not specified as traumatic: Secondary | ICD-10-CM | POA: Diagnosis not present

## 2013-12-01 DIAGNOSIS — M779 Enthesopathy, unspecified: Secondary | ICD-10-CM | POA: Diagnosis not present

## 2014-09-12 ENCOUNTER — Other Ambulatory Visit (INDEPENDENT_AMBULATORY_CARE_PROVIDER_SITE_OTHER): Payer: Self-pay | Admitting: *Deleted

## 2014-09-12 ENCOUNTER — Encounter (INDEPENDENT_AMBULATORY_CARE_PROVIDER_SITE_OTHER): Payer: Self-pay | Admitting: Internal Medicine

## 2014-09-12 ENCOUNTER — Telehealth (INDEPENDENT_AMBULATORY_CARE_PROVIDER_SITE_OTHER): Payer: Self-pay | Admitting: *Deleted

## 2014-09-12 ENCOUNTER — Ambulatory Visit (INDEPENDENT_AMBULATORY_CARE_PROVIDER_SITE_OTHER): Payer: Medicare Other | Admitting: Internal Medicine

## 2014-09-12 VITALS — BP 116/70 | HR 66 | Temp 98.1°F | Resp 18 | Ht 71.0 in | Wt 199.4 lb

## 2014-09-12 DIAGNOSIS — R1310 Dysphagia, unspecified: Secondary | ICD-10-CM

## 2014-09-12 DIAGNOSIS — R1319 Other dysphagia: Secondary | ICD-10-CM

## 2014-09-12 DIAGNOSIS — K589 Irritable bowel syndrome without diarrhea: Secondary | ICD-10-CM

## 2014-09-12 DIAGNOSIS — K279 Peptic ulcer, site unspecified, unspecified as acute or chronic, without hemorrhage or perforation: Secondary | ICD-10-CM

## 2014-09-12 DIAGNOSIS — K625 Hemorrhage of anus and rectum: Secondary | ICD-10-CM

## 2014-09-12 DIAGNOSIS — Z1211 Encounter for screening for malignant neoplasm of colon: Secondary | ICD-10-CM

## 2014-09-12 DIAGNOSIS — R1314 Dysphagia, pharyngoesophageal phase: Secondary | ICD-10-CM | POA: Diagnosis not present

## 2014-09-12 MED ORDER — DOCUSATE SODIUM 100 MG PO CAPS: 100.0000 mg | ORAL_CAPSULE | Freq: Two times a day (BID) | ORAL | Status: DC

## 2014-09-12 MED ORDER — BENEFIBER DRINK MIX PO PACK: 4.0000 g | PACK | Freq: Every day | ORAL | Status: AC

## 2014-09-12 NOTE — Patient Instructions (Signed)
Stool diary as to frequency and consistency of stools and whether or not we have since of complete evacuation. Esophagogastroduodenoscopy with esophageal dilation and colonoscopy be scheduled.

## 2014-09-12 NOTE — Telephone Encounter (Signed)
Patient needs trilyte 

## 2014-09-12 NOTE — Progress Notes (Signed)
Presenting complaint;  Irregular bowel movements and rectal bleeding and dysphagia.  Subjective:  Patient is 69 year old Caucasian male who presents for scheduled visit. He was last seen in December 2014 for rectal pain and constipation. History of anal fissure. He is continuing to use topical calcium channel blocker with lidocaine 2-3 times a week. He was also given Linzess on his last visit. He stopped this medication after taking a few doses because diarrhea. He has been taking flaxseed she stopped a few weeks ago. He was concerned if it was making him bloated. He states he is not having constipation. He is having at least 3 stools every morning. All of his stools are soft in always caliber. He has sense of incomplete evacuation quite often. He also has intermittent discomfort in left lower quadrant. He also complains of intermittent postprandial bloating. He states he had very large bowel movement about 2 weeks ago and since then his bloating is improved. He also complains of intermittent rectal bleeding. Most of the bleeding episodes occur with bowel movements but he has had few episodes when all he passes his bright red blood. On few occasions sees passed large amount of blood is usually small in amount and bright red in color. He also complains of heartburn at least 3 times a week and has experience on a food dysphagia over the last few months. He points to mid sternal area site of bolus obstruction. He denies nausea vomiting epigastric pain or melena. He states he takes full dose aspirin more for joint pains but he does not experience any side effects.  Past GI history significant for her GI bleed secondary to duodenal ulcer about 25 years ago and he required transfusion. H pylori status is unknown. He has undergone colonoscopy in 2001, 2003 and more recently in September 2011 which revealed left-sided diverticulosis and distal colitis suspicious for pseudomembranous colitis and he was treated  with metronidazole. He underwent flexible sigmoidoscopy in March 2013 for LLQ abdominal pain as well as rectal pain with defecation. He had left sided diverticulosis, anal fissure and focal nodularity at distal rectum biopsy revealed ulceration.    Current Medications: Outpatient Encounter Prescriptions as of 09/12/2014  Medication Sig  . aspirin 325 MG tablet Take 325 mg by mouth daily.   Marland Kitchen PRESCRIPTION MEDICATION Nifedipine 0.3%,Lidocaine 4%  Compounded - Patient uses on a an as needed basis.  . Probiotic Product (PROBIOTIC ACIDOPHILUS) CAPS Take 1 tablet by mouth 1 day or 1 dose.  Marland Kitchen Zinc 100 MG TABS Take 50 mg by mouth daily.   . [DISCONTINUED] diltiazem 2 % GEL Apply 1 application topically 3 (three) times daily. (Patient not taking: Reported on 09/12/2014)  . [DISCONTINUED] docusate sodium (COLACE) 100 MG capsule Take 100 mg by mouth 2 (two) times daily.  . [DISCONTINUED] magnesium 30 MG tablet Take 30 mg by mouth 2 (two) times daily.  . [DISCONTINUED] Nitroglycerin 0.4 % OINT Place 1 application rectally 2 (two) times daily. (Patient not taking: Reported on 09/12/2014)  . [DISCONTINUED] Pomegranate, Punica granatum, 250 MG CAPS Take by mouth.     Objective: Blood pressure 116/70, pulse 66, temperature 98.1 F (36.7 C), temperature source Oral, resp. rate 18, height 5\' 11"  (1.803 m), weight 199 lb 6.4 oz (90.447 kg). Patient is alert and in no acute distress. Conjunctiva is pink. Sclera is nonicteric Oropharyngeal mucosa is normal. No neck masses or thyromegaly noted. Cardiac exam with regular rhythm normal S1 and S2. No murmur or gallop noted. Lungs are clear  to auscultation. Abdomen is full. Bowel sounds are normal. On palpation abdomen is soft and nontender without organomegaly or masses. No LE edema or clubbing noted.    Assessment:  #1. Irritable bowel syndrome. He has multiple bowel movements sense of incomplete evacuation and intermittent pain. Stool caliber is always  thin. I suspect he has irritable bowel syndrome. Doubt that via dealing with sigmoid colon stricture. #2. Rectal bleeding. He has history of anal fissure and rectal ulcer. Need to rule out other etiologies as well. Last colonoscopy was over 4 years ago. #3. Solid food dysphagia. He also has intermittent heartburn relieved with Tums.    Plan:  Continue high fiber diet. Yogurt with probiotics daily. Benefiber 4 g by mouth daily at bedtime. Colace 200 mg by mouth daily at bedtime. EGD with ED and diagnostic colonoscopy in near future. Stool diary until office visit in 8 weeks.

## 2014-09-13 MED ORDER — PEG 3350-KCL-NA BICARB-NACL 420 G PO SOLR: 4000.0000 mL | Freq: Once | ORAL | Status: DC

## 2014-09-21 ENCOUNTER — Ambulatory Visit (HOSPITAL_COMMUNITY)
Admission: RE | Admit: 2014-09-21 | Discharge: 2014-09-21 | Disposition: A | Discharge status: Home / Self Care | Payer: Medicare Other | Source: Ambulatory Visit | Attending: Internal Medicine | Admitting: Internal Medicine

## 2014-09-21 ENCOUNTER — Encounter (HOSPITAL_COMMUNITY)
Admission: RE | Disposition: A | Discharge status: Home / Self Care | Payer: Self-pay | Source: Ambulatory Visit | Attending: Internal Medicine

## 2014-09-21 DIAGNOSIS — K2210 Ulcer of esophagus without bleeding: Secondary | ICD-10-CM | POA: Diagnosis not present

## 2014-09-21 DIAGNOSIS — K2961 Other gastritis with bleeding: Secondary | ICD-10-CM | POA: Diagnosis not present

## 2014-09-21 DIAGNOSIS — K2980 Duodenitis without bleeding: Secondary | ICD-10-CM | POA: Diagnosis not present

## 2014-09-21 DIAGNOSIS — K210 Gastro-esophageal reflux disease with esophagitis: Secondary | ICD-10-CM | POA: Insufficient documentation

## 2014-09-21 DIAGNOSIS — K279 Peptic ulcer, site unspecified, unspecified as acute or chronic, without hemorrhage or perforation: Secondary | ICD-10-CM

## 2014-09-21 DIAGNOSIS — Z79899 Other long term (current) drug therapy: Secondary | ICD-10-CM | POA: Diagnosis not present

## 2014-09-21 DIAGNOSIS — R1310 Dysphagia, unspecified: Secondary | ICD-10-CM | POA: Diagnosis not present

## 2014-09-21 DIAGNOSIS — K921 Melena: Secondary | ICD-10-CM | POA: Insufficient documentation

## 2014-09-21 DIAGNOSIS — K649 Unspecified hemorrhoids: Secondary | ICD-10-CM | POA: Insufficient documentation

## 2014-09-21 DIAGNOSIS — Z7982 Long term (current) use of aspirin: Secondary | ICD-10-CM | POA: Insufficient documentation

## 2014-09-21 DIAGNOSIS — K2900 Acute gastritis without bleeding: Secondary | ICD-10-CM | POA: Diagnosis not present

## 2014-09-21 DIAGNOSIS — K5730 Diverticulosis of large intestine without perforation or abscess without bleeding: Secondary | ICD-10-CM | POA: Insufficient documentation

## 2014-09-21 DIAGNOSIS — K625 Hemorrhage of anus and rectum: Secondary | ICD-10-CM | POA: Diagnosis not present

## 2014-09-21 DIAGNOSIS — K222 Esophageal obstruction: Secondary | ICD-10-CM

## 2014-09-21 DIAGNOSIS — K624 Stenosis of anus and rectum: Secondary | ICD-10-CM | POA: Diagnosis not present

## 2014-09-21 DIAGNOSIS — R194 Change in bowel habit: Secondary | ICD-10-CM | POA: Diagnosis not present

## 2014-09-21 HISTORY — PX: COLONOSCOPY: SHX5424

## 2014-09-21 HISTORY — PX: BALLOON DILATION: SHX5330

## 2014-09-21 HISTORY — PX: ESOPHAGOGASTRODUODENOSCOPY: SHX5428

## 2014-09-21 SURGERY — COLONOSCOPY
Anesthesia: Moderate Sedation

## 2014-09-21 MED ORDER — MEPERIDINE HCL 50 MG/ML IJ SOLN
INTRAMUSCULAR | Status: DC | PRN
  Administered 2014-09-21: 25 mg
  Administered 2014-09-21 (×2): 25 mg via INTRAVENOUS

## 2014-09-21 MED ORDER — SODIUM CHLORIDE 0.9 % IV SOLN
INTRAVENOUS | Status: DC
Start: 2014-09-21 — End: 2014-09-21
  Administered 2014-09-21: 1000 mL via INTRAVENOUS

## 2014-09-21 MED ORDER — MIDAZOLAM HCL 5 MG/5ML IJ SOLN
INTRAMUSCULAR | Status: AC
  Filled 2014-09-21: qty 10

## 2014-09-21 MED ORDER — STERILE WATER FOR IRRIGATION IR SOLN
Status: DC | PRN
  Administered 2014-09-21: 09:00:00

## 2014-09-21 MED ORDER — OMEPRAZOLE 20 MG PO CPDR: 20.0000 mg | DELAYED_RELEASE_CAPSULE | Freq: Two times a day (BID) | ORAL | Status: DC

## 2014-09-21 MED ORDER — MEPERIDINE HCL 50 MG/ML IJ SOLN
INTRAMUSCULAR | Status: AC
  Filled 2014-09-21: qty 1

## 2014-09-21 MED ORDER — ASPIRIN EC 81 MG PO TBEC: 162.0000 mg | DELAYED_RELEASE_TABLET | Freq: Every day | ORAL | Status: DC

## 2014-09-21 MED ORDER — MIDAZOLAM HCL 5 MG/5ML IJ SOLN
INTRAMUSCULAR | Status: DC | PRN
  Administered 2014-09-21 (×3): 1 mg via INTRAVENOUS
  Administered 2014-09-21: 2 mg via INTRAVENOUS
  Administered 2014-09-21 (×3): 1 mg via INTRAVENOUS
  Administered 2014-09-21: 2 mg via INTRAVENOUS

## 2014-09-21 NOTE — Op Note (Signed)
EGD ED AND COLONOSCOPY  PROCEDURE REPORT  PATIENT:  Luke Herrera  MR#:  [id number] Birthdate:  Mar 23, 1946, 69 y.o., male Endoscopist:  Dr. Rogene Houston, MD  Procedure Date: 09/21/2014  Procedure:   EGD, ED & Colonoscopy  Indications:  Patient is 69 year old Caucasian male who presents with few months history of solid food dysphagia and intermittent heartburn. He also has hematochezia change in caliber of stools and sense of incomplete evacuation.            Informed Consent:  The risks, benefits, alternatives & imponderables which include, but are not limited to, bleeding, infection, perforation, drug reaction and potential missed lesion have been reviewed.  The potential for biopsy, lesion removal, esophageal dilation, etc. have also been discussed.  Questions have been answered.  All parties agreeable.  Please see history & physical in medical record for more information.  Medications:  Demerol 75 mg IV Versed 10 mg IV Cetacaine spray topically for oropharyngeal anesthesia  EGD  Description of procedure:  The endoscope was introduced through the mouth and advanced to the second portion of the duodenum without difficulty or limitations. The mucosal surfaces were surveyed very carefully during advancement of the scope and upon withdrawal.  Findings:  Esophagus:  Mucosa of the proximal segment was normal. Mucosa admitted and distal esophagus revealed coarse appearance with linear furrows. Erosions noted involving distal 1 cm of the esophagus along with high-grade stricture at GEJ which was initially dilated by passing the scope and subsequently with balloon daughter as below. GEJ:  42 cm Stomach:  Stomach was empty and distended very well with insufflation. Multiple linear erosions noted at gastric body. There was focal prepyloric erythema with single erosion and a scar. Pyloric channel was patent. Angularis fundus and cardia were examined by retroflexing the scope and were  unremarkable Duodenum:  Bulbar mucosa revealed patchy erythema edema. Post bulbar mucosa was normal.  Therapeutic/Diagnostic Maneuvers Performed:   Stricture at GE junction was dilated with balloon dilator. Balloon dilator was passed with the scope. Guidewire was pushed in the gastric lumen. Balloon dilator was positioned across the stricture and insufflated to a diameter of 15 mm and maintained for a few seconds and passed distally. Mucosa was noted to have been disrupted. Balloon was deflated and withdrawn. I was able to pass the scope across stricture without any resistance. Antral biopsy was taken for CLOtest and biopsy was also taken from esophageal mucosa to rule out eosinophilic esophagitis.  COLONOSCOPY Description of procedure:  After a digital rectal exam was performed, that colonoscope was advanced from the anus through the rectum and colon to the area of the cecum, ileocecal valve and appendiceal orifice. The cecum was deeply intubated. These structures were well-seen and photographed for the record. From the level of the cecum and ileocecal valve, the scope was slowly and cautiously withdrawn. The mucosal surfaces were carefully surveyed utilizing scope tip to flexion to facilitate fold flattening as needed. The scope was pulled down into the rectum where a thorough exam including retroflexion was performed.  Findings:   Prep excellent. Redundant colon. Multiple diverticula at sigmoid colon. Anal stricture and small hemorrhoids below the dentate line.  Therapeutic/Diagnostic Maneuvers Performed:  None  Complications:  None  Cecal Withdrawal Time:  6 minutes  Impression:  EGD findings;  Erosive reflux esophagitis with high-grade stricture at GE junction which was initially dilated with the scope and then with balloon dilator to 15 mm.  Biopsies taken from mucosa of esophagus to rule  out eosinophilic esophagitis. Multiple gastric erosions. Antral biopsy taken for CLOtest. Bulbar  duodenitis.  Colonoscopy findings;  Examination performed to cecum. Redundant colon. Multiple diverticula at sigmoid colon but no evidence of colonic stricture. Anal stenosis and small external hemorrhoids.    Recommendations: Standard instructions given Omeprazole 20 mg by mouth twice a day.  I will be contacting patient with biopsy results. Office visit in 3 months.  Sakiya Stepka U  09/21/2014 10:50 AM  CC: Dr. Chevis Pretty, FNP & Dr. Rayne Du ref. provider found

## 2014-09-21 NOTE — Discharge Instructions (Signed)
Decrease aspirin to 2 baby aspirins daily. Resume other medications as before. Resume usual diet. Omeprazole 20 mg by mouth 30 minutes before breakfast and evening meal daily. Physician will call with biopsy results. Office visit in 3 months.  Esophagogastroduodenoscopy Care After Refer to this sheet in the next few weeks. These instructions provide you with information on caring for yourself after your procedure. Your caregiver may also give you more specific instructions. Your treatment has been planned according to current medical practices, but problems sometimes occur. Call your caregiver if you have any problems or questions after your procedure.  HOME CARE INSTRUCTIONS  Do not eat or drink anything until the numbing medicine (local anesthetic) has worn off and your gag reflex has returned. You will know that the local anesthetic has worn off when you can swallow comfortably.  Do not drive for 12 hours after the procedure or as directed by your caregiver.  Only take medicines as directed by your caregiver. SEEK MEDICAL CARE IF:   You cannot stop coughing.  You are not urinating at all or less than usual. SEEK IMMEDIATE MEDICAL CARE IF:  You have difficulty swallowing.  You cannot eat or drink.  You have worsening throat or chest pain.  You have dizziness, lightheadedness, or you faint.  You have nausea or vomiting.  You have chills.  You have a fever.  You have severe abdominal pain.  You have black, tarry, or bloody stools. Document Released: 07/20/2012 Document Reviewed: 07/20/2012 [facility] Patient Information 2015 [place]. This information is not intended to replace advice given to you by your health care provider. Make sure you discuss any questions you have with your health care provider. Colonoscopy, Care After These instructions give you information on caring for yourself after your procedure. Your doctor may also give you more specific instructions.  Call your doctor if you have any problems or questions after your procedure. HOME CARE  Do not drive for 24 hours.  Do not sign important papers or use machinery for 24 hours.  You may shower.  You may go back to your usual activities, but go slower for the first 24 hours.  Take rest breaks often during the first 24 hours.  Walk around or use warm packs on your belly (abdomen) if you have belly cramping or gas.  Drink enough fluids to keep your pee (urine) clear or pale yellow.  Resume your normal diet. Avoid heavy or fried foods.  Avoid drinking alcohol for 24 hours or as told by your doctor.  Only take medicines as told by your doctor. If a tissue sample (biopsy) was taken during the procedure:   Do not take aspirin or blood thinners for 7 days, or as told by your doctor.  Do not drink alcohol for 7 days, or as told by your doctor.  Eat soft foods for the first 24 hours. GET HELP IF: You still have a small amount of blood in your poop (stool) 2-3 days after the procedure. GET HELP RIGHT AWAY IF:  You have more than a small amount of blood in your poop.  You see clumps of tissue (blood clots) in your poop.  Your belly is puffy (swollen).  You feel sick to your stomach (nauseous) or throw up (vomit).  You have a fever.  You have belly pain that gets worse and medicine does not help. MAKE SURE YOU:  Understand these instructions.  Will watch your condition.  Will get help right away if you are  not doing well or get worse. Document Released: 09/05/2010 Document Revised: 08/08/2013 Document Reviewed: 04/10/2013 [facility] Patient Information 2015 [place], [place]. This information is not intended to replace advice given to you by your health care provider. Make sure you discuss any questions you have with your health care provider.

## 2014-09-21 NOTE — H&P (Signed)
Luke Herrera is an 69 y.o. male.   Chief Complaint: Patient is here for EGD, ED and colonoscopy. HPI: Patient is 69 year old Caucasian male who presents with few months history of solid food dysphagia. He has heartburn about 3 times a week. He points to mid sternal areas site of bolus obstruction. He denies epigastric pain or melena. He also gives history of rectal bleeding usually with this bowel movements. He has history of anal fissure and rectal ulcer. He has noted in caliber to his stools and has sense of incomplete evacuation. Details of his history can be found in my note from 09/12/2014.  Past Medical History  Diagnosis Date  . Diverticulitis   . Diverticulosis     Past Surgical History  Procedure Laterality Date  . Tonsillectomy    . Hemorrhoid surgery    . Flexible sigmoidoscopy  10/16/2011    Procedure: FLEXIBLE SIGMOIDOSCOPY;  Surgeon: Rogene Houston, MD;  Location: [facility];  Service: Endoscopy;  Laterality: N/A;  930    Family History  Problem Relation Age of Onset  . Anesthesia problems Neg Hx   . Hypotension Neg Hx   . Malignant hyperthermia Neg Hx   . Pseudochol deficiency Neg Hx   . Healthy Son   . Thyroid disease Daughter    Social History:  reports that he has never smoked. He has never used smokeless tobacco. He reports that he does not drink alcohol or use illicit drugs.  Allergies: No Known Allergies  Medications Prior to Admission  Medication Sig Dispense Refill  . aspirin 325 MG tablet Take 325 mg by mouth daily.     Marland Kitchen docusate sodium (COLACE) 100 MG capsule Take 1 capsule (100 mg total) by mouth 2 (two) times daily. 1 capsule 0  . polyethylene glycol-electrolytes (NULYTELY/GOLYTELY) 420 G solution Take 4,000 mLs by mouth once. 4000 mL 0  . PRESCRIPTION MEDICATION Nifedipine 0.3%,Lidocaine 4%  Compounded - Patient uses on a an as needed basis.    . Probiotic Product (PROBIOTIC ACIDOPHILUS) CAPS Take 1 tablet by mouth daily.     . Wheat Dextrin  (BENEFIBER DRINK MIX) PACK Take 4 g by mouth at bedtime.    . Zinc 100 MG TABS Take 50 mg by mouth daily.       No results found for this or any previous visit (from the past 48 hour(s)). No results found.  ROS  Blood pressure 149/76, pulse 53, temperature 97.6 F (36.4 C), temperature source Oral, resp. rate 9, SpO2 97 %. Physical Exam  Constitutional: He appears well-developed and well-nourished.  HENT:  Mouth/Throat: Oropharynx is clear and moist.  Eyes: Conjunctivae are normal. No scleral icterus.  Neck: No thyromegaly present.  Cardiovascular: Normal rate, regular rhythm and normal heart sounds.   No murmur heard. Respiratory: Effort normal and breath sounds normal.  GI: Soft. He exhibits no distension and no mass. There is no tenderness.  Small umbilical hernia.  Musculoskeletal: He exhibits no edema.  Lymphadenopathy:    He has no cervical adenopathy.  Neurological: He is alert.  Skin: Skin is warm and dry.     Assessment/Plan Solid food dysphagia. Rectal bleeding and change in bowel habits. EGD with ED and colonoscopy.  Mikie Misner U 09/21/2014, 9:21 AM

## 2014-09-22 LAB — CLOTEST (H. PYLORI), BIOPSY: HELICOBACTER SCREEN: NEGATIVE

## 2014-09-24 ENCOUNTER — Encounter (HOSPITAL_COMMUNITY): Payer: Self-pay | Admitting: Internal Medicine

## 2014-11-12 ENCOUNTER — Ambulatory Visit (INDEPENDENT_AMBULATORY_CARE_PROVIDER_SITE_OTHER): Payer: Medicare Other | Admitting: Internal Medicine

## 2014-11-12 ENCOUNTER — Encounter (INDEPENDENT_AMBULATORY_CARE_PROVIDER_SITE_OTHER): Payer: Self-pay | Admitting: Internal Medicine

## 2014-11-12 VITALS — BP 118/68 | HR 66 | Temp 98.3°F | Resp 18 | Ht 71.0 in | Wt 200.0 lb

## 2014-11-12 DIAGNOSIS — K222 Esophageal obstruction: Secondary | ICD-10-CM

## 2014-11-12 DIAGNOSIS — K2100 Gastro-esophageal reflux disease with esophagitis, without bleeding: Secondary | ICD-10-CM

## 2014-11-12 DIAGNOSIS — K589 Irritable bowel syndrome without diarrhea: Secondary | ICD-10-CM

## 2014-11-12 MED ORDER — OMEPRAZOLE 20 MG PO CPDR: 20.0000 mg | DELAYED_RELEASE_CAPSULE | Freq: Every day | ORAL | Status: DC

## 2014-11-12 NOTE — Progress Notes (Signed)
Presenting complaint;  Follow-up for GERD, dysphagia and IBS.  Subjective:  Patient is 69 year old Caucasian male who is here for scheduled visit. He was initially seen on 09/12/2024 for irregular bowel movements rectal bleeding and dysphagia. Her with EGD and colonoscopy on 10/16/2011. EGD revealed high-grade stricture at GE junction which was dilated to 15 mm. C from esophageal body was negative for eosinophilic esophagitis and CLOtest was negative. Colonoscopy revealed redundant colon and multiple diverticula at sigmoid colon angle stenosis and external hemorrhoids. He was asked to continue omeprazole begun on probiotic fiber supplement and stool softener. He has Stool diary. Review of diarrhea reveals he is having anywhere from 1-6 stools per day. One 2 days he had 6 stools in another 2 days he had 5 stools but on 40 days out of 61 days he had 3 bowel movements. Most of his stools are soft and formed. He denies rectal bleeding. Heartburns well controlled with PPI. He had single episode of dysphagia while he was this eating biscuit at church yesterday. He complains of dizziness. He believes dizziness may be secondary to Prilosec. He reports soreness across lower abdomen with physical activity but it resolves within a day or 2.    Current Medications: Outpatient Encounter Prescriptions as of 11/12/2014  Medication Sig  . aspirin EC 81 MG tablet Take 2 tablets (162 mg total) by mouth daily.  Marland Kitchen docusate sodium (COLACE) 100 MG capsule Take 1 capsule (100 mg total) by mouth 2 (two) times daily.  Marland Kitchen omeprazole (PRILOSEC) 20 MG capsule Take 1 capsule (20 mg total) by mouth 2 (two) times daily before a meal.  . PRESCRIPTION MEDICATION Nifedipine 0.3%,Lidocaine 4%  Compounded - Patient uses on a an as needed basis.  . Probiotic Product (PROBIOTIC ACIDOPHILUS) CAPS Take 1 tablet by mouth daily.   . Wheat Dextrin (BENEFIBER DRINK MIX) PACK Take 4 g by mouth at bedtime.  . Zinc 100 MG TABS Take 50 mg  by mouth daily.      Objective: Blood pressure 118/68, pulse 66, temperature 98.3 F (36.8 C), temperature source Oral, resp. rate 18, height 5\' 11"  (1.803 m), weight 200 lb (90.719 kg).  patient is alert and in no acute distress. Conjunctiva is pink. Sclera is nonicteric Oropharyngeal mucosa is normal. No neck masses or thyromegaly noted. Cardiac exam with regular rhythm normal S1 and S2. No murmur or gallop noted. Lungs are clear to auscultation. Abdomen is symmetrical soft and nontender without organomegaly or masses. No LE edema or clubbing noted.    Assessment:  #1. Chronic GERD complicated by distal esophageal stricture which was last dilated to 15 mm. Stricture will need to be redilated if he starts to experience dysphagia more often. #2. Irritable bowel syndrome. He is having an average of 2-3 bowel movements per day. However once every two weeks he has 5-6 bowel movements per day. #3. Dizziness possibly secondary to omeprazole. If this symptom does not resolve with dose reduction will consider switching to another PPI.  Plan:  Patient advised to call office if he has dysphagia to 3 times a day given week in which case would proceed with EGD with ED. Decrease omeprazole to 20 mg by mouth every morning. He will continue Colace probiotic and fiber supplement. All with progress report in 2-3 months or earlier if dizziness gets worse. Office visit in one year.

## 2014-11-12 NOTE — Patient Instructions (Signed)
Can take OTC Zantac 150 mg for breakthrough symptoms in the evening as needed. Call if you have three episodes of swallowing difficulty in one week. Progress report in 2-3 months.

## 2014-12-13 ENCOUNTER — Telehealth (INDEPENDENT_AMBULATORY_CARE_PROVIDER_SITE_OTHER): Payer: Self-pay | Admitting: *Deleted

## 2014-12-13 NOTE — Telephone Encounter (Signed)
Luke Herrera would like to speak with Dr. Laural Golden if he would please return his call at [phone].

## 2014-12-14 NOTE — Telephone Encounter (Signed)
I have called the patient ,left a message asking that they call back.

## 2015-03-06 ENCOUNTER — Telehealth: Payer: Self-pay | Admitting: Family Medicine

## 2015-03-07 NOTE — Telephone Encounter (Signed)
Pt aware.

## 2015-03-22 ENCOUNTER — Encounter: Payer: Self-pay | Admitting: Family Medicine

## 2015-03-22 ENCOUNTER — Ambulatory Visit (INDEPENDENT_AMBULATORY_CARE_PROVIDER_SITE_OTHER): Payer: Medicare Other | Admitting: Family Medicine

## 2015-03-22 ENCOUNTER — Encounter (INDEPENDENT_AMBULATORY_CARE_PROVIDER_SITE_OTHER): Payer: Self-pay

## 2015-03-22 VITALS — BP 135/70 | HR 65 | Temp 97.4°F | Ht 71.0 in | Wt 196.4 lb

## 2015-03-22 DIAGNOSIS — T148 Other injury of unspecified body region: Secondary | ICD-10-CM | POA: Diagnosis not present

## 2015-03-22 DIAGNOSIS — K5790 Diverticulosis of intestine, part unspecified, without perforation or abscess without bleeding: Secondary | ICD-10-CM | POA: Diagnosis not present

## 2015-03-22 DIAGNOSIS — S80921A Unspecified superficial injury of right lower leg, initial encounter: Secondary | ICD-10-CM | POA: Diagnosis not present

## 2015-03-22 DIAGNOSIS — R200 Anesthesia of skin: Secondary | ICD-10-CM | POA: Diagnosis not present

## 2015-03-22 DIAGNOSIS — N400 Enlarged prostate without lower urinary tract symptoms: Secondary | ICD-10-CM

## 2015-03-22 DIAGNOSIS — W57XXXA Bitten or stung by nonvenomous insect and other nonvenomous arthropods, initial encounter: Secondary | ICD-10-CM | POA: Insufficient documentation

## 2015-03-22 DIAGNOSIS — Z0000 Encounter for general adult medical examination without abnormal findings: Secondary | ICD-10-CM | POA: Diagnosis not present

## 2015-03-22 LAB — POCT CBC
GRANULOCYTE PERCENT: 63.9 % (ref 37–80)
HCT, POC: 49.2 % (ref 43.5–53.7)
Hemoglobin: 15.4 g/dL (ref 14.1–18.1)
Lymph, poc: 2 (ref 0.6–3.4)
MCH, POC: 27.5 pg (ref 27–31.2)
MCHC: 31.3 g/dL — AB (ref 31.8–35.4)
MCV: 87.9 fL (ref 80–97)
MPV: 7.2 fL (ref 0–99.8)
POC GRANULOCYTE: 4.4 (ref 2–6.9)
POC LYMPH PERCENT: 28.3 %L (ref 10–50)
Platelet Count, POC: 259 10*3/uL (ref 142–424)
RBC: 5.6 M/uL (ref 4.69–6.13)
RDW, POC: 13.2 %
WBC: 6.9 10*3/uL (ref 4.6–10.2)

## 2015-03-22 NOTE — Progress Notes (Addendum)
Patient ID: Luke Herrera, male   DOB: September 25, 1945, 69 y.o.   MRN: [id number]   HPI  Patient presents today to establish care  Tick bite Patient explains that he gets several sick bites per week. Last one was about a week ago. He found one crawling on him this morning. He works outside quite a bit. Occasionally his tick bites will get large red and swollen. He does not remember any target rashes. He denies any joint pains specifically. He does have night sweats and increased fatigue that is unusual over the last few weeks to months. Most recent tick bite was on R leg.   Numbness in his hands He has bilateral numbness in his hands, started on the right in the third through fifth fingers and has also started to affect his left hand. He denies any loss of strength or function. He has mild midline neck pain is also associated with this. He describes it as a mild numbness and tingling type sensation of the fingers whenever he is not active pieces hands in certain position for any amount of time. The numbness and tingling does not radiate but is limited to his hands.  His past medical, surgical, social, and family history reviewed in detail and updated in relevant portions of EMR  PMH: Smoking status noted ROS: Per HPI, otherwise negative  Objective: BP 135/70 mmHg  Pulse 65  Temp(Src) 97.4 F (36.3 C) (Oral)  Ht _0  (1.803 m)  Wt 196 lb 6.4 oz (89.086 kg)  BMI 27.40 kg/m2 Gen: NAD, alert, cooperative with exam HEENT: NCAT, TMs WNL BL, nares clear, oropharynx clear Neck: Supple, trachea midline, small tender lymphadenopathy on right neck CV: RRR, good S1/S2, no murmur, no carotid bruit Resp: CTABL, no wheezes, non-labored Abd: SNTND, BS present, no guarding or organomegaly Ext: No edema, warm Neuro: Alert and oriented, No gross deficits Rectal: Prostate slightly large, soft, normal rectal tone Skin: No obvious rashes on sun exposed areas  Assessment and plan:  Tick  bite Multiple take backs, no rashes No joint pains Has had recent night sweats and inappropriate fatigue, he would like to be checked for Lyme and [facility] spotted fever Labs collected  Numbness Bilateral hand numbness in third through fifth fingers occasionally, no loss of strength or function Some mild neck pain, possibly neck etiology Also consider B-12 deficiency but unable to justify B-12 check today (insurance diagnosis reqs) so will check CBC and if RBCs are macrocytic then will check B-12   BPH (benign prostatic hyperplasia) Asymptomatic, prostate slightly enlarged on exam Checking PSA after full discussion with patient   Orders Placed This Encounter  Procedures  . CMP14+EGFR  . Lyme Ab/Western Blot Reflex  . [place] [facility] spotted fvr abs pnl(IgG+IgM)  . PSA  . POCT [place], MD [facility] 03/22/2015, 10:19 AM

## 2015-03-22 NOTE — Assessment & Plan Note (Signed)
Asymptomatic, prostate slightly enlarged on exam Checking PSA after full discussion with patient

## 2015-03-22 NOTE — Assessment & Plan Note (Signed)
Bilateral hand numbness in third through fifth fingers occasionally, no loss of strength or function Some mild neck pain, possibly neck etiology Also consider B-12 deficiency but unable to justify B-12 check today (insurance diagnosis reqs) so will check CBC and if RBCs are macrocytic then will check B-12

## 2015-03-22 NOTE — Assessment & Plan Note (Addendum)
Multiple take backs, no rashes Recent bit eon R leg No joint pains Has had recent night sweats and inappropriate fatigue, he would like to be checked for Lyme and [facility] spotted fever Labs collected

## 2015-03-22 NOTE — Patient Instructions (Addendum)
Great to meet you!   Come back in 1 year, keep an eye on your blood pressure if it is consistently over 140/90 please come back sooner

## 2015-03-26 LAB — CMP14+EGFR
ALT: 16 IU/L (ref 0–44)
AST: 15 IU/L (ref 0–40)
Albumin/Globulin Ratio: 1.8 (ref 1.1–2.5)
Albumin: 4.1 g/dL (ref 3.5–4.8)
Alkaline Phosphatase: 75 IU/L (ref 39–117)
BILIRUBIN TOTAL: 0.4 mg/dL (ref 0.0–1.2)
BUN/Creatinine Ratio: 12 (ref 10–22)
BUN: 13 mg/dL (ref 8–27)
CO2: 25 mmol/L (ref 18–29)
Calcium: 9.3 mg/dL (ref 8.6–10.2)
Chloride: 104 mmol/L (ref 97–108)
Creatinine, Ser: 1.06 mg/dL (ref 0.76–1.27)
GFR calc Af Amer: 82 mL/min/{1.73_m2} (ref >59)
GFR calc non Af Amer: 71 mL/min/{1.73_m2} (ref >59)
Globulin, Total: 2.3 g/dL (ref 1.5–4.5)
Glucose: 103 mg/dL — ABNORMAL HIGH (ref 65–99)
Potassium: 4.8 mmol/L (ref 3.5–5.2)
Sodium: 142 mmol/L (ref 134–144)
Total Protein: 6.4 g/dL (ref 6.0–8.5)

## 2015-03-26 LAB — ROCKY MTN SPOTTED FVR ABS PNL(IGG+IGM)
RMSF IGG: UNDETERMINED
RMSF IgM: 0.36 index (ref 0.00–0.89)

## 2015-03-26 LAB — PSA: Prostate Specific Ag, Serum: 3.4 ng/mL (ref 0.0–4.0)

## 2015-03-26 LAB — LYME AB/WESTERN BLOT REFLEX
LYME DISEASE AB, QUANT, IGM: <0.8 index (ref 0.00–0.79)
Lyme IgG/IgM Ab: <0.91 {ISR} (ref 0.00–0.90)

## 2015-03-26 LAB — RMSF, IGG, IFA

## 2015-03-27 ENCOUNTER — Telehealth: Payer: Self-pay | Admitting: Family Medicine

## 2015-03-27 MED ORDER — DOXYCYCLINE HYCLATE 100 MG PO TABS: 100.0000 mg | ORAL_TABLET | Freq: Two times a day (BID) | ORAL | Status: DC

## 2015-03-27 NOTE — Telephone Encounter (Signed)
lmtcb

## 2015-03-27 NOTE — Telephone Encounter (Signed)
He had nonsp[ecific malaise and decreased energy with multiple tick exposures. He is weakly positive for RMSF which could indicate previous infection. However given his age I think it most prudent to treat and have hoim follow up in the few days if he is feeling unwell.   Treat with Doxy.   Other labs look good including kidney, liver, elcetrolytes, blood counts, and prostate.   Will ask nursing to discuss and inform as I am out of the office.   Laroy Apple, MD [facility] 03/27/2015, 10:21 AM

## 2015-04-01 ENCOUNTER — Telehealth: Payer: Self-pay | Admitting: *Deleted

## 2015-04-01 NOTE — Telephone Encounter (Signed)
Patient called back for lab results.  Informed patient of lab results.  Also informed that a Rx for doxycycline was sent to pharmacy.  Patient also states that he no longer has a rash and only feels poor if he is out doing work.  Message sent to Dr. Wendi Snipes.

## 2015-04-01 NOTE — Telephone Encounter (Signed)
Called and discussed labs and doxy. He was weekly Positive for RMSF IgG which could indicate past infection. He is still having headaches but feels better in general. He filled the doxy and would like to try it. Recommended yogurt for probiotic effect.   Laroy Apple, MD [facility] 04/01/2015, 12:13 PM

## 2015-05-18 DIAGNOSIS — I214 Non-ST elevation (NSTEMI) myocardial infarction: Secondary | ICD-10-CM

## 2015-05-18 HISTORY — DX: Non-ST elevation (NSTEMI) myocardial infarction: I21.4

## 2015-05-20 DIAGNOSIS — R110 Nausea: Secondary | ICD-10-CM | POA: Diagnosis not present

## 2015-05-20 DIAGNOSIS — S0990XA Unspecified injury of head, initial encounter: Secondary | ICD-10-CM | POA: Diagnosis not present

## 2015-05-20 DIAGNOSIS — R55 Syncope and collapse: Secondary | ICD-10-CM | POA: Diagnosis not present

## 2015-05-20 DIAGNOSIS — R1013 Epigastric pain: Secondary | ICD-10-CM | POA: Diagnosis not present

## 2015-05-20 DIAGNOSIS — R079 Chest pain, unspecified: Secondary | ICD-10-CM | POA: Diagnosis not present

## 2015-05-20 DIAGNOSIS — S3991XA Unspecified injury of abdomen, initial encounter: Secondary | ICD-10-CM | POA: Diagnosis not present

## 2015-05-20 DIAGNOSIS — I440 Atrioventricular block, first degree: Secondary | ICD-10-CM | POA: Diagnosis not present

## 2015-05-20 DIAGNOSIS — W19XXXA Unspecified fall, initial encounter: Secondary | ICD-10-CM | POA: Diagnosis not present

## 2015-05-20 DIAGNOSIS — S299XXA Unspecified injury of thorax, initial encounter: Secondary | ICD-10-CM | POA: Diagnosis not present

## 2015-05-20 DIAGNOSIS — W1789XA Other fall from one level to another, initial encounter: Secondary | ICD-10-CM | POA: Diagnosis not present

## 2015-05-20 DIAGNOSIS — Z743 Need for continuous supervision: Secondary | ICD-10-CM | POA: Diagnosis not present

## 2015-05-20 DIAGNOSIS — S3993XA Unspecified injury of pelvis, initial encounter: Secondary | ICD-10-CM | POA: Diagnosis not present

## 2015-05-20 DIAGNOSIS — I213 ST elevation (STEMI) myocardial infarction of unspecified site: Secondary | ICD-10-CM | POA: Diagnosis not present

## 2015-05-21 DIAGNOSIS — I272 Other secondary pulmonary hypertension: Secondary | ICD-10-CM | POA: Diagnosis not present

## 2015-05-21 DIAGNOSIS — I361 Nonrheumatic tricuspid (valve) insufficiency: Secondary | ICD-10-CM | POA: Diagnosis not present

## 2015-05-21 DIAGNOSIS — Z7982 Long term (current) use of aspirin: Secondary | ICD-10-CM | POA: Diagnosis not present

## 2015-05-21 DIAGNOSIS — I440 Atrioventricular block, first degree: Secondary | ICD-10-CM | POA: Diagnosis present

## 2015-05-21 DIAGNOSIS — E7800 Pure hypercholesterolemia, unspecified: Secondary | ICD-10-CM | POA: Diagnosis present

## 2015-05-21 DIAGNOSIS — I10 Essential (primary) hypertension: Secondary | ICD-10-CM | POA: Diagnosis present

## 2015-05-21 DIAGNOSIS — I214 Non-ST elevation (NSTEMI) myocardial infarction: Secondary | ICD-10-CM | POA: Diagnosis not present

## 2015-05-21 DIAGNOSIS — I493 Ventricular premature depolarization: Secondary | ICD-10-CM | POA: Diagnosis not present

## 2015-05-21 DIAGNOSIS — R9431 Abnormal electrocardiogram [ECG] [EKG]: Secondary | ICD-10-CM | POA: Diagnosis not present

## 2015-05-21 DIAGNOSIS — Z8249 Family history of ischemic heart disease and other diseases of the circulatory system: Secondary | ICD-10-CM | POA: Diagnosis not present

## 2015-05-21 DIAGNOSIS — I252 Old myocardial infarction: Secondary | ICD-10-CM | POA: Diagnosis not present

## 2015-05-21 DIAGNOSIS — I25118 Atherosclerotic heart disease of native coronary artery with other forms of angina pectoris: Secondary | ICD-10-CM | POA: Diagnosis not present

## 2015-05-21 DIAGNOSIS — R55 Syncope and collapse: Secondary | ICD-10-CM | POA: Diagnosis not present

## 2015-05-21 DIAGNOSIS — Z8711 Personal history of peptic ulcer disease: Secondary | ICD-10-CM | POA: Diagnosis not present

## 2015-05-21 DIAGNOSIS — I25119 Atherosclerotic heart disease of native coronary artery with unspecified angina pectoris: Secondary | ICD-10-CM | POA: Diagnosis present

## 2015-05-29 ENCOUNTER — Telehealth: Payer: Self-pay | Admitting: Family Medicine

## 2015-05-29 ENCOUNTER — Telehealth: Payer: Self-pay

## 2015-05-29 NOTE — Telephone Encounter (Signed)
Called tl discuss recent NSATEMI, No answer. He has cardiology follow up.   Laroy Apple, MD [facility] 05/29/2015, 1:16 PM

## 2015-05-29 NOTE — Telephone Encounter (Signed)
Called and discussed recent heart attack, he feels well now. No complaints.   Laroy Apple, MD [facility] 05/29/2015, 3:45 PM

## 2015-06-04 ENCOUNTER — Encounter: Payer: Self-pay | Admitting: Cardiology

## 2015-06-04 ENCOUNTER — Ambulatory Visit (INDEPENDENT_AMBULATORY_CARE_PROVIDER_SITE_OTHER): Payer: Medicare Other | Admitting: Cardiology

## 2015-06-04 VITALS — BP 130/64 | HR 69 | Ht 71.0 in | Wt 199.0 lb

## 2015-06-04 DIAGNOSIS — I2510 Atherosclerotic heart disease of native coronary artery without angina pectoris: Secondary | ICD-10-CM

## 2015-06-04 DIAGNOSIS — I214 Non-ST elevation (NSTEMI) myocardial infarction: Secondary | ICD-10-CM

## 2015-06-04 DIAGNOSIS — Z136 Encounter for screening for cardiovascular disorders: Secondary | ICD-10-CM | POA: Diagnosis not present

## 2015-06-04 DIAGNOSIS — Z5181 Encounter for therapeutic drug level monitoring: Secondary | ICD-10-CM

## 2015-06-04 DIAGNOSIS — E785 Hyperlipidemia, unspecified: Secondary | ICD-10-CM | POA: Diagnosis not present

## 2015-06-04 MED ORDER — ATORVASTATIN CALCIUM 10 MG PO TABS: 10.0000 mg | ORAL_TABLET | Freq: Every day | ORAL | Status: DC

## 2015-06-04 NOTE — Progress Notes (Signed)
Cardiology Office Note  Date: 06/04/2015   ID: NICCOLO BURGGRAF, DOB 03/08/1946, MRN [id number]  PCP: Kenn File, MD  Consulting Cardiologist: Rozann Lesches, MD   Chief Complaint  Patient presents with  . Coronary Artery Disease    History of Present Illness: Luke Herrera is a 69 y.o. male presenting to establish cardiology follow-up. Records indicate recent NSTEMI while the patient was traveling in [place], peak troponin I up to 21. Presentation was with syncope, preceded by some symptoms of indigestion and chest discomfort radiating up to the jaw. He underwent cardiac catheterization at [facility] revealing a nondominant RCA with diffuse disease and occlusion associated with right to right collaterals. No PCI was undertaken and he was managed medically. Echocardiogram reported LVEF of 60-65% without regional wall motion abnormalities or RV dysfunction.  He comes in today with his wife for follow-up visit. He states that he stopped Plavix, Lipitor, and Lopressor, complaining of headache. He indicates trying different combinations of these medications, but always had a headache. Otherwise, he had no myalgias, no reported bleeding problems.  Today we reviewed records and findings of his cardiac catheterization, rationale for ongoing medical therapy with recent NSTEMI, also gradual return to regular activities. 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  ECG today shows normal sinus rhythm increased voltage and decreased R wave progression, nonspecific ST changes.  He does not report any chest pain at this time, NYHA class II dyspnea. He has had no palpitations or recurrent syncope.  Past Medical History  Diagnosis Date  . Diverticulitis   . Diverticulosis   . History of [facility] spotted fever   . NSTEMI (non-ST elevated myocardial infarction) [facility]) October 2016  . CAD (coronary artery disease)     Occluded nondominant RCA with right  to right collaterals - 05/2015, [place]     Past Surgical History  Procedure Laterality Date  . Tonsillectomy    . Hemorrhoid surgery    . Flexible sigmoidoscopy  10/16/2011    Procedure: FLEXIBLE SIGMOIDOSCOPY;  Surgeon: Rogene Houston, MD;  [facility];  Service: Endoscopy;  Laterality: N/A;  930  . Colonoscopy N/A 09/21/2014    Procedure: COLONOSCOPY;  Surgeon: Rogene Houston, MD;  [facility];  Service: Endoscopy;  Laterality: N/A;  930  . Esophagogastroduodenoscopy N/A 09/21/2014    Procedure: ESOPHAGOGASTRODUODENOSCOPY (EGD);  Surgeon: Rogene Houston, MD;  [facility];  Service: Endoscopy;  Laterality: N/A;  . Balloon dilation N/A 09/21/2014    Procedure: BALLOON DILATION;  Surgeon: Rogene Houston, MD;  [facility];  Service: Endoscopy;  Laterality: N/A;    Current Outpatient Prescriptions  Medication Sig Dispense Refill  . aspirin EC 81 MG tablet Take 2 tablets (162 mg total) by mouth daily.    . clopidogrel (PLAVIX) 75 MG tablet Take 75 mg by mouth daily.    Marland Kitchen docusate sodium (COLACE) 100 MG capsule Take 1 capsule (100 mg total) by mouth 2 (two) times daily. 1 capsule 0  . PRESCRIPTION MEDICATION Nifedipine 0.3%,Lidocaine 4%  Compounded - Patient uses on a an as needed basis.    . Probiotic Product (PROBIOTIC ACIDOPHILUS) CAPS Take 1 tablet by mouth daily.     . Wheat Dextrin (BENEFIBER DRINK MIX) PACK Take 4 g by mouth at bedtime.    . Zinc 100 MG TABS Take 50 mg by mouth daily.     Marland Kitchen atorvastatin (LIPITOR)  10 MG tablet Take 1 tablet (10 mg total) by mouth daily. 90 tablet 3   No current facility-administered medications for this visit.    Allergies:  Review of patient's allergies indicates no known allergies.   Social History: The patient  reports that he has never smoked. He has never used smokeless tobacco. He reports that he does not drink alcohol or use illicit drugs.   Family History: The  patient's family history includes Cancer in his maternal grandfather; Healthy in his son; Thyroid disease in his daughter. There is no history of Anesthesia problems, Hypotension, Malignant hyperthermia, or Pseudochol deficiency.   ROS:  Please see the history of present illness. Otherwise, complete review of systems is positive for intermittent arthritic pains depending on level of activity. All other systems are reviewed and negative.   Physical Exam: VS:  BP 130/64 mmHg  Pulse 69  Ht 5\' 11"  (1.803 m)  Wt 199 lb (90.266 kg)  BMI 27.77 kg/m2  SpO2 93%, BMI Body mass index is 27.77 kg/(m^2).  Wt Readings from Last 3 Encounters:  06/04/15 199 lb (90.266 kg)  03/22/15 196 lb 6.4 oz (89.086 kg)  11/12/14 200 lb (90.719 kg)     General: Patient appears comfortable at rest. HEENT: Conjunctiva and lids normal, oropharynx clear with moist mucosa. Neck: Supple, no elevated JVP or carotid bruits, no thyromegaly. Lungs: Clear to auscultation, nonlabored breathing at rest. Cardiac: Regular rate and rhythm, no S3 or significant systolic murmur, no pericardial rub. Abdomen: Soft, nontender, bowel sounds present, no guarding or rebound. Extremities: No pitting edema, distal pulses 2+. Skin: Warm and dry. Musculoskeletal: No kyphosis. Neuropsychiatric: Alert and oriented x3, affect grossly appropriate.   ECG: ECG is ordered today.   Recent Labwork: 03/22/2015: ALT 16; AST 15; BUN 13; Creatinine, Ser 1.06; Hemoglobin 15.4; Potassium 4.8; Sodium 142  October 2016 : Hemoglobin 14.4, platelets 239, potassium 4.4, BUN 10, creatinine 0.9  Other Studies Reviewed Today:  Patient's wife provided CD of recent cardiac catheterization in [place], I reviewed the images.   Assessment and Plan:  1.  Status post NSTEMI in early October while traveling in [place] as outlined above, peak troponin I up to 21. Patient had a syncopal event proceeded by presumed angina symptoms, had brief CPR per his  wife but came around without other intervention, and records do not indicate any malignant arrhythmias detected during hospital stay. He has normal LVEF of 60-65% by echocardiogram, and at this time is clinically stable. I did recommend that he try to stay on both aspirin and Plavix, we will hold off on metoprolol for now.  2. CAD with recently documented occlusion of a nondominant RCA, apparent culprit for NSTEMI.  We discussed gradual resumption of activities, he is active at baseline doing farm work. Office follow-up arranged.  3. No history of hyperlipidemia, but placed on statin therapy with recent ACS. I could not locate lipid panel in his records. He is concerned about statin therapy, particularly at high dose. We agreed for him to try Lipitor 10 mg daily to see if he tolerates this better. Follow-up FLP and LFT for next visit.  Current medicines were reviewed with the patient today.   Orders Placed This Encounter  Procedures  . Hepatic function panel  . Lipid panel  . EKG 12-Lead    Disposition: FU with me in 2 months.   Signed, Satira Sark, MD, [facility] 06/04/2015 12:06 PM    [place] [facility] [place]  [address], [place], [place] [address] Phone: [phone]; Fax: [phone]

## 2015-06-04 NOTE — Patient Instructions (Signed)
Your physician has recommended you make the following change in your medication:  Stop metoprolol. Decrease your atorvastatin to 10 mg daily. Let us know how you are tolerating this dosage of lipitor. Continue all other medications the same. Your physician recommends that you return for a FASTING lipid/liover profile in 2 months just before your next visit in December 2016. Your physician recommends that you schedule a follow-up appointment in: 2 months.

## 2015-06-10 ENCOUNTER — Telehealth: Payer: Self-pay | Admitting: Cardiology

## 2015-06-10 MED ORDER — NITROGLYCERIN 0.4 MG SL SUBL: 0.4000 mg | SUBLINGUAL_TABLET | SUBLINGUAL | Status: DC | PRN

## 2015-06-10 NOTE — Telephone Encounter (Signed)
Medication sent to pharmacy. Pt aware. ?

## 2015-06-10 NOTE — Telephone Encounter (Signed)
Luke Herrera has lost his prescription for Nitroglycerin 0.4 mg that was given to him . Wants to know if another RX can be called to [facility].

## 2015-06-24 ENCOUNTER — Telehealth: Payer: Self-pay | Admitting: *Deleted

## 2015-06-24 MED ORDER — CLOPIDOGREL BISULFATE 75 MG PO TABS: 75.0000 mg | ORAL_TABLET | Freq: Every day | ORAL | Status: DC

## 2015-06-24 NOTE — Telephone Encounter (Signed)
Plavix 75mg  sent to [facility] at patient request.

## 2015-07-29 DIAGNOSIS — E785 Hyperlipidemia, unspecified: Secondary | ICD-10-CM | POA: Diagnosis not present

## 2015-07-29 DIAGNOSIS — Z5181 Encounter for therapeutic drug level monitoring: Secondary | ICD-10-CM | POA: Diagnosis not present

## 2015-07-29 DIAGNOSIS — Z79899 Other long term (current) drug therapy: Secondary | ICD-10-CM | POA: Diagnosis not present

## 2015-07-31 ENCOUNTER — Ambulatory Visit (INDEPENDENT_AMBULATORY_CARE_PROVIDER_SITE_OTHER): Payer: Medicare Other | Admitting: Cardiology

## 2015-07-31 ENCOUNTER — Encounter: Payer: Self-pay | Admitting: Cardiology

## 2015-07-31 VITALS — BP 112/70 | HR 68 | Ht 71.0 in | Wt 203.0 lb

## 2015-07-31 DIAGNOSIS — I2510 Atherosclerotic heart disease of native coronary artery without angina pectoris: Secondary | ICD-10-CM

## 2015-07-31 DIAGNOSIS — E785 Hyperlipidemia, unspecified: Secondary | ICD-10-CM | POA: Diagnosis not present

## 2015-07-31 NOTE — Patient Instructions (Signed)
Continue all current medications. Your physician wants you to follow up in: 6 months.  You will receive a reminder letter in the mail one-two months in advance.  If you don't receive a letter, please call our office to schedule the follow up appointment   

## 2015-07-31 NOTE — Progress Notes (Signed)
Cardiology Office Note  Date: 07/31/2015   ID: LUKE BOODY, DOB 1946-02-01, MRN [id number]  PCP: Kenn File, MD  Primary Cardiologist: Rozann Lesches, MD   Chief Complaint  Patient presents with  . Coronary Artery Disease    History of Present Illness: Luke Herrera is a 69 y.o. male last seen in October. He is here today with his wife for a follow-up visit. Reports no significant change in functional capacity, eager to start getting back outside to do his regular chores. He is not reporting any obvious angina symptoms and has not used any nitroglycerin.  At the last visit it was recommended that he continue on aspirin and Plavix, we also tried low-dose Lipitor with plan for follow-up FLP and LFT. He states that he has taken his medications, although in general he prefers to not take anything if possible. We discussed his recent lipids. He does not endorse any obvious side effects from his medicines, still has a mild intermittent headache at times.  Past Medical History  Diagnosis Date  . Diverticulitis   . Diverticulosis   . History of [facility] spotted fever   . NSTEMI (non-ST elevated myocardial infarction) [facility]) October 2016  . CAD (coronary artery disease)     Occluded nondominant RCA with right to right collaterals - 05/2015, [place]     Current Outpatient Prescriptions  Medication Sig Dispense Refill  . aspirin EC 81 MG tablet Take 2 tablets (162 mg total) by mouth daily.    Marland Kitchen atorvastatin (LIPITOR) 10 MG tablet Take 1 tablet (10 mg total) by mouth daily. 90 tablet 3  . clopidogrel (PLAVIX) 75 MG tablet Take 1 tablet (75 mg total) by mouth daily. 30 tablet 6  . docusate sodium (COLACE) 100 MG capsule Take 1 capsule (100 mg total) by mouth 2 (two) times daily. 1 capsule 0  . nitroGLYCERIN (NITROSTAT) 0.4 MG SL tablet Place 1 tablet (0.4 mg total) under the tongue every 5 (five) minutes as needed for chest pain. 25  tablet 3  . PRESCRIPTION MEDICATION Nifedipine 0.3%,Lidocaine 4%  Compounded - Patient uses on a an as needed basis.    . Probiotic Product (PROBIOTIC ACIDOPHILUS) CAPS Take 1 tablet by mouth daily.     . Wheat Dextrin (BENEFIBER DRINK MIX) PACK Take 4 g by mouth at bedtime.    . Zinc 100 MG TABS Take 50 mg by mouth daily.      No current facility-administered medications for this visit.   Allergies:  Review of patient's allergies indicates no known allergies.   Social History: The patient  reports that he has never smoked. He has never used smokeless tobacco. He reports that he does not drink alcohol or use illicit drugs.    ROS:  Please see the history of present illness. Otherwise, complete review of systems is positive for none.  All other systems are reviewed and negative.   Physical Exam: VS:  BP 112/70 mmHg  Pulse 68  Ht 5\' 11"  (1.803 m)  Wt 203 lb (92.08 kg)  BMI 28.33 kg/m2  SpO2 94%, BMI Body mass index is 28.33 kg/(m^2).  Wt Readings from Last 3 Encounters:  07/31/15 203 lb (92.08 kg)  06/04/15 199 lb (90.266 kg)  03/22/15 196 lb 6.4 oz (89.086 kg)    General: Patient appears comfortable at rest. HEENT: Conjunctiva and lids normal, oropharynx clear with moist mucosa. Neck: Supple, no elevated JVP or carotid bruits, no thyromegaly. Lungs: Clear to  auscultation, nonlabored breathing at rest. Cardiac: Regular rate and rhythm, no S3 or significant systolic murmur, no pericardial rub. Abdomen: Soft, nontender, bowel sounds present, no guarding or rebound. Extremities: No pitting edema, distal pulses 2+. Skin: Warm and dry.  ECG: Tracing from 06/04/2015 showed sinus rhythm with LVH, decreased R wave progression, and nonspecific ST-T changes.  Recent Labwork: 03/22/2015: ALT 16; AST 15; BUN 13; Creatinine, Ser 1.06; Hemoglobin 15.4; Potassium 4.8; Sodium 142  December 2016: ALT 16, AST 16, cholesterol 166, triglycerides 148, LDL 102, HDL 35  Assessment and Plan:  1. CAD  status post NSTEMI back in October, known occluded nondominant RCA with right right collaterals that was managed medically in [place]. He is symptomatically stable at this time. Continuing aspirin and Plavix, also statin therapy. He has preferred to keep his medical regimen at a minimum.  2. Hyperlipidemia, recent LDL 102. Will recheck again around next visit to see if lipid numbers have come down further presuming he remains consistent with his Lipitor.  Current medicines were reviewed with the patient today.  Disposition: FU with me in 6 months.   Signed, Satira Sark, MD, [facility] 07/31/2015 11:23 AM    [facility] [place], [place], [place] [address] Phone: [phone]; Fax: [phone]

## 2015-08-28 ENCOUNTER — Encounter: Payer: Self-pay | Admitting: Family Medicine

## 2015-08-28 ENCOUNTER — Encounter (INDEPENDENT_AMBULATORY_CARE_PROVIDER_SITE_OTHER): Payer: Self-pay | Admitting: Internal Medicine

## 2015-10-14 ENCOUNTER — Telehealth: Payer: Self-pay | Admitting: Cardiology

## 2015-10-14 NOTE — Telephone Encounter (Signed)
Took himself off of the Liptor due to joints hurting would like to know what he can do now

## 2015-10-14 NOTE — Telephone Encounter (Signed)
Several options to consider depending on what he would like to do. We could try switching to Pravachol at low dose. We can use Zetia. We could also try Niaspan. He and his wife could discuss these medications and see which one he would like to try.

## 2015-10-15 NOTE — Telephone Encounter (Signed)
Patient informed and verbalized understanding of plan. Patient will call office back with his decision.

## 2015-11-14 ENCOUNTER — Ambulatory Visit (INDEPENDENT_AMBULATORY_CARE_PROVIDER_SITE_OTHER): Payer: Medicare Other | Admitting: Internal Medicine

## 2015-11-14 ENCOUNTER — Encounter (INDEPENDENT_AMBULATORY_CARE_PROVIDER_SITE_OTHER): Payer: Self-pay | Admitting: Internal Medicine

## 2015-11-14 VITALS — BP 118/50 | HR 70 | Temp 98.2°F | Ht 71.0 in | Wt 202.4 lb

## 2015-11-14 DIAGNOSIS — R1319 Other dysphagia: Secondary | ICD-10-CM

## 2015-11-14 DIAGNOSIS — K589 Irritable bowel syndrome without diarrhea: Secondary | ICD-10-CM

## 2015-11-14 DIAGNOSIS — K219 Gastro-esophageal reflux disease without esophagitis: Secondary | ICD-10-CM

## 2015-11-14 NOTE — Progress Notes (Signed)
Subjective:    Patient ID: Luke Herrera, male    DOB: Dec 25, 1945, 70 y.o.   MRN: [id number]  HPI Here today for f/u of his GERD,dysphagia, and IBS. He was last seen by Dr. Laural Golden in March of 2016. He tells me he is doing good. No dysphagia. He can eat what he wants. His acid reflux is controlled with Omeparzole. No abdominal pain. He has a BM 2-3 stools a day. His stools are formed. He is exercising. He walks 2-4 miles a day.  He tells me he feels pretty good. . Suffered an MI in October of 2016 while in [place]  and is now maintained on Plavix.          09/21/2014 EGD, ED & Colonoscopy  Indications: Patient is 70 year old Caucasian male who presents with few months history of solid food dysphagia and intermittent heartburn. He also has hematochezia change in caliber of stools and sense of incomplete evacuation. EGD findings;  Erosive reflux esophagitis with high-grade stricture at GE junction which was initially dilated with the scope and then with balloon dilator to 15 mm.  Biopsies taken from mucosa of esophagus to rule out eosinophilic esophagitis. Multiple gastric erosions. Antral biopsy taken for CLOtest. Bulbar duodenitis.  Colonoscopy findings;  Examination performed to cecum. Redundant colon. Multiple diverticula at sigmoid colon but no evidence of colonic stricture. Anal stenosis and small external hemorrhoids.    Review of Systems Past Medical History  Diagnosis Date  . Diverticulitis   . Diverticulosis   . History of [facility] spotted fever   . NSTEMI (non-ST elevated myocardial infarction) [facility]) October 2016  . CAD (coronary artery disease)     Occluded nondominant RCA with right to right collaterals - 05/2015, [place]     Past Surgical History  Procedure Laterality Date  . Tonsillectomy    . Hemorrhoid surgery    .  Flexible sigmoidoscopy  10/16/2011    Procedure: FLEXIBLE SIGMOIDOSCOPY;  Surgeon: Rogene Houston, MD;  [facility];  Service: Endoscopy;  Laterality: N/A;  930  . Colonoscopy N/A 09/21/2014    Procedure: COLONOSCOPY;  Surgeon: Rogene Houston, MD;  [facility];  Service: Endoscopy;  Laterality: N/A;  930  . Esophagogastroduodenoscopy N/A 09/21/2014    Procedure: ESOPHAGOGASTRODUODENOSCOPY (EGD);  Surgeon: Rogene Houston, MD;  [facility];  Service: Endoscopy;  Laterality: N/A;  . Balloon dilation N/A 09/21/2014    Procedure: BALLOON DILATION;  Surgeon: Rogene Houston, MD;  [facility];  Service: Endoscopy;  Laterality: N/A;    No Known Allergies  Current Outpatient Prescriptions on File Prior to Visit  Medication Sig Dispense Refill  . aspirin EC 81 MG tablet Take 2 tablets (162 mg total) by mouth daily.    . clopidogrel (PLAVIX) 75 MG tablet Take 1 tablet (75 mg total) by mouth daily. 30 tablet 6  . docusate sodium (COLACE) 100 MG capsule Take 1 capsule (100 mg total) by mouth 2 (two) times daily. 1 capsule 0  . nitroGLYCERIN (NITROSTAT) 0.4 MG SL tablet Place 1 tablet (0.4 mg total) under the tongue every 5 (five) minutes as needed for chest pain. 25 tablet 3  . Probiotic Product (PROBIOTIC ACIDOPHILUS) CAPS Take 1 tablet by mouth daily.     . Wheat Dextrin (BENEFIBER DRINK MIX) PACK Take 4 g by mouth at bedtime.    . Zinc 100 MG TABS Take 50 mg by mouth daily.  No current facility-administered medications on file prior to visit.        Objective:   Physical Exam Blood pressure 118/50, pulse 70, temperature 98.2 F (36.8 C), height 5\' 11"  (1.803 m), weight 202 lb 6.4 oz (91.808 kg). Alert and oriented. Skin warm and dry. Oral mucosa is moist.   . Sclera anicteric, conjunctivae is pink. Thyroid not enlarged. No cervical lymphadenopathy. Lungs clear. Heart regular rate and rhythm.  Abdomen is soft. Bowel sounds are positive. No  hepatomegaly. No abdominal masses felt. No tenderness.  No edema to lower extremities.         Assessment & Plan:  GERD controlled at this time. Dysphagia. No problems with dysphagia. He can eat anything he wants. IBS: No problems. Has included Benefiber in his diet. Having one stool a day.  OV in 1 year.

## 2015-11-14 NOTE — Patient Instructions (Signed)
OV in 1 year.  

## 2015-12-30 DIAGNOSIS — M542 Cervicalgia: Secondary | ICD-10-CM | POA: Diagnosis not present

## 2015-12-30 DIAGNOSIS — M25512 Pain in left shoulder: Secondary | ICD-10-CM | POA: Diagnosis not present

## 2016-01-09 ENCOUNTER — Other Ambulatory Visit: Payer: Self-pay | Admitting: *Deleted

## 2016-01-09 MED ORDER — CLOPIDOGREL BISULFATE 75 MG PO TABS: 75.0000 mg | ORAL_TABLET | Freq: Every day | ORAL | Status: DC

## 2016-01-24 ENCOUNTER — Ambulatory Visit (INDEPENDENT_AMBULATORY_CARE_PROVIDER_SITE_OTHER): Payer: Medicare Other | Admitting: Cardiology

## 2016-01-24 ENCOUNTER — Encounter: Payer: Self-pay | Admitting: Cardiology

## 2016-01-24 VITALS — BP 140/72 | HR 70 | Ht 71.0 in | Wt 202.4 lb

## 2016-01-24 DIAGNOSIS — I2510 Atherosclerotic heart disease of native coronary artery without angina pectoris: Secondary | ICD-10-CM | POA: Diagnosis not present

## 2016-01-24 DIAGNOSIS — Z889 Allergy status to unspecified drugs, medicaments and biological substances status: Secondary | ICD-10-CM

## 2016-01-24 DIAGNOSIS — E785 Hyperlipidemia, unspecified: Secondary | ICD-10-CM | POA: Diagnosis not present

## 2016-01-24 DIAGNOSIS — Z789 Other specified health status: Secondary | ICD-10-CM

## 2016-01-24 MED ORDER — CLOPIDOGREL BISULFATE 75 MG PO TABS: 75.0000 mg | ORAL_TABLET | Freq: Every day | ORAL | Status: DC

## 2016-01-24 NOTE — Progress Notes (Signed)
Cardiology Office Note  Date: 01/24/2016   ID: AVERELL BRINTNALL, DOB [birth year], MRN [id number]  PCP: Kenn File, MD  Primary Cardiologist: Rozann Lesches, MD   Chief Complaint  Patient presents with  . Coronary Artery Disease    History of Present Illness: Luke Herrera is a 70 y.o. male last seen in December 2016. He is here today with his wife for a follow-up visit. He reports no major decline in stamina, still enjoys being outdoors most of the day, is able to walk up to 3 miles at a slow pace. He does not report any angina symptoms or nitroglycerin use.  Since I last saw him he stopped Lipitor due to arthralgias. He is on a fiber supplement, and does not want to try any other medications for lipid reduction.  I reviewed his other medications which are outlined below. He prefers to keep a simple cardiac regimen, is on aspirin and Plavix with nitroglycerin available.  Past Medical History  Diagnosis Date  . Diverticulitis   . Diverticulosis   . History of [facility] spotted fever   . NSTEMI (non-ST elevated myocardial infarction) [facility]) October 2016  . CAD (coronary artery disease)     Occluded nondominant RCA with right to right collaterals - 05/2015, [place]     Current Outpatient Prescriptions  Medication Sig Dispense Refill  . aspirin EC 81 MG tablet Take 2 tablets (162 mg total) by mouth daily.    . clopidogrel (PLAVIX) 75 MG tablet Take 1 tablet (75 mg total) by mouth daily. 30 tablet 6  . docusate sodium (COLACE) 100 MG capsule Take 1 capsule (100 mg total) by mouth 2 (two) times daily. 1 capsule 0  . nitroGLYCERIN (NITROSTAT) 0.4 MG SL tablet Place 1 tablet (0.4 mg total) under the tongue every 5 (five) minutes as needed for chest pain. 25 tablet 3  . Probiotic Product (PROBIOTIC ACIDOPHILUS) CAPS Take 1 tablet by mouth daily.     . Wheat Dextrin (BENEFIBER DRINK MIX) PACK Take 4 g by mouth at bedtime.    . Zinc 100  MG TABS Take 50 mg by mouth daily.      No current facility-administered medications for this visit.   Allergies:  Review of patient's allergies indicates no known allergies.   Social History: The patient  reports that he has never smoked. He has never used smokeless tobacco. He reports that he does not drink alcohol or use illicit drugs.   ROS:  Please see the history of present illness. Otherwise, complete review of systems is positive for left shoulder arthritic discomfort.  All other systems are reviewed and negative.   Physical Exam: VS:  BP 140/72 mmHg  Pulse 70  Ht 5\' 11"  (1.803 m)  Wt 202 lb 6.4 oz (91.808 kg)  BMI 28.24 kg/m2  SpO2 95%, BMI Body mass index is 28.24 kg/(m^2).  Wt Readings from Last 3 Encounters:  01/24/16 202 lb 6.4 oz (91.808 kg)  11/14/15 202 lb 6.4 oz (91.808 kg)  07/31/15 203 lb (92.08 kg)    General: Patient appears comfortable at rest. HEENT: Conjunctiva and lids normal, oropharynx clear with moist mucosa. Neck: Supple, no elevated JVP or carotid bruits, no thyromegaly. Lungs: Clear to auscultation, nonlabored breathing at rest. Cardiac: Regular rate and rhythm, no S3 or significant systolic murmur, no pericardial rub. Abdomen: Soft, nontender, bowel sounds present, no guarding or rebound. Extremities: No pitting edema, distal pulses 2+. Skin: Warm and dry.  ECG: I personally reviewed the tracing from 06/04/2015 which showed sinus rhythm with LVH, decreased R wave progression, and nonspecific ST-T changes.  Recent Labwork: 03/22/2015: ALT 16; AST 15; BUN 13; Creatinine, Ser 1.06; Hemoglobin 15.4; Potassium 4.8; Sodium 142  December 2016: ALT 16, AST 16, cholesterol 166, triglycerides 148, LDL 102, HDL 35  Assessment and Plan:  1. CAD with occluded nondominant RCA associated with right to right collaterals and NSTEMI back in October 2016. He is symptomatically stable at this time, we will continue dual antiplatelet therapy for at least the duration  of the year.  2. Hyperlipidemia with statin intolerance. Continue dietary fiber. He does not want to try other medication options at this time.  Current medicines were reviewed with the patient today.  Disposition: FU with me in 6 months.   Signed, Satira Sark, MD, [facility] 01/24/2016 2:00 PM    [place] at [place], [place], [place] [address] Phone: [phone]; Fax: [phone]

## 2016-01-24 NOTE — Patient Instructions (Signed)

## 2016-01-28 ENCOUNTER — Encounter: Payer: Self-pay | Admitting: Family Medicine

## 2016-01-28 ENCOUNTER — Ambulatory Visit (INDEPENDENT_AMBULATORY_CARE_PROVIDER_SITE_OTHER): Payer: Medicare Other | Admitting: Family Medicine

## 2016-01-28 VITALS — BP 132/79 | HR 69 | Temp 97.0°F | Ht 71.0 in | Wt 197.8 lb

## 2016-01-28 DIAGNOSIS — I2510 Atherosclerotic heart disease of native coronary artery without angina pectoris: Secondary | ICD-10-CM

## 2016-01-28 DIAGNOSIS — W57XXXA Bitten or stung by nonvenomous insect and other nonvenomous arthropods, initial encounter: Secondary | ICD-10-CM

## 2016-01-28 DIAGNOSIS — R51 Headache: Secondary | ICD-10-CM

## 2016-01-28 DIAGNOSIS — Z0000 Encounter for general adult medical examination without abnormal findings: Secondary | ICD-10-CM | POA: Insufficient documentation

## 2016-01-28 DIAGNOSIS — T148 Other injury of unspecified body region: Secondary | ICD-10-CM | POA: Diagnosis not present

## 2016-01-28 DIAGNOSIS — R5383 Other fatigue: Secondary | ICD-10-CM | POA: Diagnosis not present

## 2016-01-28 MED ORDER — DOXYCYCLINE HYCLATE 100 MG PO TABS: 100.0000 mg | ORAL_TABLET | Freq: Two times a day (BID) | ORAL | Status: DC

## 2016-01-28 NOTE — Progress Notes (Signed)
   HPI  Patient presents today here for illness after take by.  Patient explains that about 2 weeks ago he had engorged tic removed. Over the last week or 2 he's had joint pains, headaches, fatigue. He denies any cough or sore throat.  He has had mild dyspnea.  He states he gets several tick bite to year. They usually make a large red welt and then resolved. That's exactly what his last few take bites have done. He is not had any target-like rashes.  PMH: Smoking status noted ROS: Per HPI  Objective: BP 132/79 mmHg  Pulse 69  Temp(Src) 97 F (36.1 C) (Oral)  Ht _0  (1.803 m)  Wt 197 lb 12.8 oz (89.721 kg)  BMI 27.60 kg/m2 Gen: NAD, alert, cooperative with exam HEENT: NCAT, nares clear, oropharynx clear, TMs normal bilaterally CV: RRR, good S1/S2, no murmur Resp: CTABL, no wheezes, non-labored Ext: No edema, warm Neuro: Alert and oriented, No gross deficits  Assessment and plan:  # Tick bite Lesions resolved Now has symptoms and is concerned about tick borne illness Considering engorged tick  And symptoms I am covering with doxy. I did place labs, however he mistakenly left prior to collection Will ask nursing to arrange labs tomorrow if possible.   RTC with any concerns, worsening symps, or failure to improve  HCM Hep C drawn   Orders Placed This Encounter  Procedures  . Lyme Ab/Western Blot Reflex  . [place] m[place] spotted fvr abs pnl(IgG+IgM)  . CBC with Differential  . CMP14+EGFR  . Hepatitis C antibody    Meds ordered this encounter  Medications  . doxycycline (VIBRA-TABS) 100 MG tablet    Sig: Take 1 tablet (100 mg total) by mouth 2 (two) times daily. 1 po bid    Dispense:  28 tablet    Refill:  0    Laroy Apple, MD [facility] 01/28/2016, 5:51 PM

## 2016-01-28 NOTE — Patient Instructions (Addendum)
Great to see you!  Be sure to take all of the antibiotics, take a probiotic while you are on it.   Let us know if you need anything

## 2016-01-30 LAB — CBC WITH DIFFERENTIAL/PLATELET
BASOS ABS: 0.1 10*3/uL (ref 0.0–0.2)
BASOS: 1 %
EOS (ABSOLUTE): 0.4 10*3/uL (ref 0.0–0.4)
Eos: 5 %
Hematocrit: 45 % (ref 37.5–51.0)
Hemoglobin: 15.2 g/dL (ref 12.6–17.7)
IMMATURE GRANS (ABS): 0 10*3/uL (ref 0.0–0.1)
IMMATURE GRANULOCYTES: 0 %
LYMPHS: 27 %
Lymphocytes Absolute: 2.3 10*3/uL (ref 0.7–3.1)
MCH: 29.6 pg (ref 26.6–33.0)
MCHC: 33.8 g/dL (ref 31.5–35.7)
MCV: 88 fL (ref 79–97)
MONOS ABS: 0.6 10*3/uL (ref 0.1–0.9)
Monocytes: 7 %
NEUTROS PCT: 60 %
Neutrophils Absolute: 5.2 10*3/uL (ref 1.4–7.0)
PLATELETS: 259 10*3/uL (ref 150–379)
RBC: 5.13 x10E6/uL (ref 4.14–5.80)
RDW: 13.7 % (ref 12.3–15.4)
WBC: 8.6 10*3/uL (ref 3.4–10.8)

## 2016-01-30 LAB — CMP14+EGFR
A/G RATIO: 1.7 (ref 1.2–2.2)
ALT: 14 IU/L (ref 0–44)
AST: 19 IU/L (ref 0–40)
Albumin: 4.3 g/dL (ref 3.5–4.8)
Alkaline Phosphatase: 78 IU/L (ref 39–117)
BUN/Creatinine Ratio: 14 (ref 10–24)
BUN: 15 mg/dL (ref 8–27)
Bilirubin Total: <0.2 mg/dL (ref 0.0–1.2)
CALCIUM: 8.7 mg/dL (ref 8.6–10.2)
CO2: 20 mmol/L (ref 18–29)
Chloride: 101 mmol/L (ref 96–106)
Creatinine, Ser: 1.11 mg/dL (ref 0.76–1.27)
GFR, EST AFRICAN AMERICAN: 77 mL/min/{1.73_m2} (ref >59)
GFR, EST NON AFRICAN AMERICAN: 66 mL/min/{1.73_m2} (ref >59)
GLUCOSE: 79 mg/dL (ref 65–99)
Globulin, Total: 2.6 g/dL (ref 1.5–4.5)
POTASSIUM: 4.1 mmol/L (ref 3.5–5.2)
Sodium: 143 mmol/L (ref 134–144)
TOTAL PROTEIN: 6.9 g/dL (ref 6.0–8.5)

## 2016-01-30 LAB — LYME AB/WESTERN BLOT REFLEX: Lyme IgG/IgM Ab: <0.91 {ISR} (ref 0.00–0.90)

## 2016-01-30 LAB — HEPATITIS C ANTIBODY: Hep C Virus Ab: <0.1 s/co ratio (ref 0.0–0.9)

## 2016-01-30 LAB — ROCKY MTN SPOTTED FVR ABS PNL(IGG+IGM)
RMSF IGG: NEGATIVE
RMSF IGM: 0.48 {index} (ref 0.00–0.89)

## 2016-02-12 DIAGNOSIS — M542 Cervicalgia: Secondary | ICD-10-CM | POA: Diagnosis not present

## 2016-02-12 DIAGNOSIS — M25512 Pain in left shoulder: Secondary | ICD-10-CM | POA: Diagnosis not present

## 2016-04-27 ENCOUNTER — Encounter: Payer: Self-pay | Admitting: Family Medicine

## 2016-04-27 ENCOUNTER — Ambulatory Visit (INDEPENDENT_AMBULATORY_CARE_PROVIDER_SITE_OTHER): Payer: Medicare Other | Admitting: Family Medicine

## 2016-04-27 VITALS — BP 134/73 | HR 73 | Temp 97.3°F | Ht 71.0 in | Wt 202.0 lb

## 2016-04-27 DIAGNOSIS — Z0000 Encounter for general adult medical examination without abnormal findings: Secondary | ICD-10-CM

## 2016-04-27 NOTE — Progress Notes (Signed)
Subjective:    Luke Herrera is a 70 y.o. male who presents for Medicare Annual/Subsequent preventive examination.   Preventive Screening-Counseling & Management  Tobacco History  Smoking Status  . Never Smoker  Smokeless Tobacco  . Never Used    Problems Prior to Visit 1. See below  Had a cold last week, improving.   Current Problems (verified) Patient Active Problem List   Diagnosis Date Noted  . Healthcare maintenance 01/28/2016  . Annual physical exam 03/22/2015  . Numbness 03/22/2015  . Tick bite 03/22/2015  . BPH (benign prostatic hyperplasia) 03/22/2015  . Unspecified constipation 07/18/2013  . Fissure in ano 11/09/2011  . Diverticulosis 09/01/2011    Medications Prior to Visit Current Outpatient Prescriptions on File Prior to Visit  Medication Sig Dispense Refill  . aspirin EC 81 MG tablet Take 2 tablets (162 mg total) by mouth daily.    . clopidogrel (PLAVIX) 75 MG tablet Take 1 tablet (75 mg total) by mouth daily. 30 tablet 6  . docusate sodium (COLACE) 100 MG capsule Take 1 capsule (100 mg total) by mouth 2 (two) times daily. 1 capsule 0  . nitroGLYCERIN (NITROSTAT) 0.4 MG SL tablet Place 1 tablet (0.4 mg total) under the tongue every 5 (five) minutes as needed for chest pain. 25 tablet 3  . Probiotic Product (PROBIOTIC ACIDOPHILUS) CAPS Take 1 tablet by mouth daily.     . Wheat Dextrin (BENEFIBER DRINK MIX) PACK Take 4 g by mouth at bedtime.    . Zinc 100 MG TABS Take 50 mg by mouth daily.      No current facility-administered medications on file prior to visit.     Current Medications (verified) Current Outpatient Prescriptions  Medication Sig Dispense Refill  . aspirin EC 81 MG tablet Take 2 tablets (162 mg total) by mouth daily.    . clopidogrel (PLAVIX) 75 MG tablet Take 1 tablet (75 mg total) by mouth daily. 30 tablet 6  . docusate sodium (COLACE) 100 MG capsule Take 1 capsule (100 mg total) by mouth 2 (two) times daily. 1 capsule 0  .  nitroGLYCERIN (NITROSTAT) 0.4 MG SL tablet Place 1 tablet (0.4 mg total) under the tongue every 5 (five) minutes as needed for chest pain. 25 tablet 3  . Probiotic Product (PROBIOTIC ACIDOPHILUS) CAPS Take 1 tablet by mouth daily.     . Wheat Dextrin (BENEFIBER DRINK MIX) PACK Take 4 g by mouth at bedtime.    . Zinc 100 MG TABS Take 50 mg by mouth daily.      No current facility-administered medications for this visit.      Allergies (verified) Review of patient's allergies indicates no known allergies.   PAST HISTORY  Family History Family History  Problem Relation Age of Onset  . Healthy Son   . Thyroid disease Daughter   . Cancer Maternal Grandfather   . Anesthesia problems Neg Hx   . Hypotension Neg Hx   . Malignant hyperthermia Neg Hx   . Pseudochol deficiency Neg Hx     Social History Social History  Substance Use Topics  . Smoking status: Never Smoker  . Smokeless tobacco: Never Used  . Alcohol use No    Are there smokers in your home (other than you)?  No  Risk Factors Current exercise habits: Home exercise routine includes walking 1 hrs per day. approx 6-7 days per week  Dietary issues discussed: none  Cardiac risk factors: day.  Depression Screen (Note: if answer to either of  the following is "Yes", a more complete depression screening is indicated)   Q1: Over the past two weeks, have you felt down, depressed or hopeless? No  Q2: Over the past two weeks, have you felt little interest or pleasure in doing things? No  Have you lost interest or pleasure in daily life? No  Do you often feel hopeless? No  Do you cry easily over simple problems? No  Activities of Daily Living In your present state of health, do you have any difficulty performing the following activities?:  Driving? No Managing money?  No Feeding yourself? No Getting from bed to chair? No Climbing a flight of stairs? No Preparing food and eating?: No Bathing or showering? No Getting  dressed: No Getting to the toilet? No Using the toilet:No Moving around from place to place: No In the past year have you fallen or had a near fall?:No   Are you sexually active?  Yes  Do you have more than one partner?  No  Hearing Difficulties: Yes Do you often ask people to speak up or repeat themselves? Yes Do you experience ringing or noises in your ears? Yes Do you have difficulty understanding soft or whispered voices? Yes   Do you feel that you have a problem with memory? No  Do you often misplace items? No  Do you feel safe at home?  Yes  Cognitive Testing  Alert? Yes  Normal Appearance?Yes  Oriented to person? Yes  Place? Yes   Time? Yes  Recall of three objects?  Yes  Can perform simple calculations? Yes  Displays appropriate judgment?Yes  Can read the correct time from a watch face?Yes   Advanced Directives have been discussed with the patient? Yes   List the Names of Other Physician/Practitioners you currently use: 1.  None  Indicate any recent Medical Services you may have received from other than [facility] providers in the past year (date may be approximate).   There is no immunization history on file for this patient.  Screening Tests Health Maintenance  Topic Date Due  . TETANUS/TDAP  10/16/1963  . ZOSTAVAX  10/15/2004  . PNA vac Low Risk Adult (1 of 2 - PCV13) 10/15/2009  . INFLUENZA VACCINE  03/17/2016  . COLONOSCOPY  09/21/2024  . Hepatitis C Screening  Completed    All answers were reviewed with the patient and necessary referrals were made:  Kenn File, MD   04/27/2016   History reviewed: allergies, current medications, past family history, past medical history, past social history, past surgical history and problem list  Review of Systems Pertinent items are noted in HPI.    Objective:     Blood pressure 134/73, pulse 73, temperature 97.3 F (36.3 C), temperature source Oral, height 5\' 11"  (1.803 m), weight 202 lb (91.6 kg). Body  mass index is 28.17 kg/m.  Gen: NAD, alert, cooperative with exam HEENT: NCAT, EOMI, PERRL CV: RRR, good S1/S2, no murmur Resp: CTABL, no wheezes, non-labored Ext: No edema, warm Neuro: Alert and oriented, No gross deficits      Assessment:     [name] Is a pleasant 70 year old male here for his annual wellness visit. He has no complaints.  I recommended a pneumonia and shingles vaccine today, he declined both. He also states that he does not want a flu shot when they're available.  He had a tetanus shot 4 years ago in the emergency room.  He has some issues with hearing but is not ready to consider hearing  aids do to cost.     Plan:     During the course of the visit the patient was educated and counseled about appropriate screening and preventive services including:    Pneumococcal vaccine   Influenza vaccine  Td vaccine  Diet review for nutrition referral? No   Patient Instructions (the written plan) was given to the patient.  Medicare Attestation I have personally reviewed: The patient's medical and social history Their use of alcohol, tobacco or illicit drugs Their current medications and supplements The patient's functional ability including ADLs,fall risks, home safety risks, cognitive, and hearing and visual impairment Diet and physical activities Evidence for depression or mood disorders  The patient's weight, height, BMI, and visual acuity have been recorded in the chart.  I have made referrals, counseling, and provided education to the patient based on review of the above and I have provided the patient with a written personalized care plan for preventive services.     Kenn File, MD   04/27/2016

## 2016-07-29 NOTE — Progress Notes (Signed)
Cardiology Office Note  Date: 07/31/2016   ID: CHARITY SCHILL, DOB 01-Nov-1945, MRN [id number]  PCP: Kenn File, MD  Primary Cardiologist: Rozann Lesches, MD   Chief Complaint  Patient presents with  . Coronary Artery Disease    History of Present Illness: Luke Herrera is a 70 y.o. male last seen in June. He is here with his wife today for a routine follow-up visit. Continues to do well, reports occasional angina, still able to maintain his usual activities outdoors and has not required any nitroglycerin.  He remains on aspirin and Plavix. As noted previously he has elected not to take statin therapy.  I reviewed his ECG today which shows sinus rhythm.  Past Medical History:  Diagnosis Date  . CAD (coronary artery disease)    Occluded nondominant RCA with right to right collaterals - 05/2015, [facility], [place]   . Diverticulitis   . Diverticulosis   . History of [facility] spotted fever   . NSTEMI (non-ST elevated myocardial infarction) [facility]) October 2016    Current Outpatient Prescriptions  Medication Sig Dispense Refill  . aspirin EC 81 MG tablet Take 2 tablets (162 mg total) by mouth daily.    . clopidogrel (PLAVIX) 75 MG tablet Take 1 tablet (75 mg total) by mouth daily. 30 tablet 6  . docusate sodium (COLACE) 100 MG capsule Take 1 capsule (100 mg total) by mouth 2 (two) times daily. 1 capsule 0  . nitroGLYCERIN (NITROSTAT) 0.4 MG SL tablet Place 1 tablet (0.4 mg total) under the tongue every 5 (five) minutes as needed for chest pain. 25 tablet 3  . Probiotic Product (PROBIOTIC ACIDOPHILUS) CAPS Take 1 tablet by mouth daily.     . Wheat Dextrin (BENEFIBER DRINK MIX) PACK Take 4 g by mouth at bedtime.    . Zinc 100 MG TABS Take 50 mg by mouth daily.      No current facility-administered medications for this visit.    Allergies:  Patient has no known allergies.   Social History: The patient  reports that he has never  smoked. He has never used smokeless tobacco. He reports that he does not drink alcohol or use drugs.   ROS:  Please see the history of present illness. Otherwise, complete review of systems is positive for occasional dizziness, no palpitations or syncope.  All other systems are reviewed and negative.   Physical Exam: VS:  BP 128/77   Pulse 64   Ht 5\' 11"  (1.803 m)   Wt 199 lb (90.3 kg)   BMI 27.75 kg/m , BMI Body mass index is 27.75 kg/m.  Wt Readings from Last 3 Encounters:  07/31/16 199 lb (90.3 kg)  04/27/16 202 lb (91.6 kg)  01/28/16 197 lb 12.8 oz (89.7 kg)    General: Patient appears comfortable at rest. HEENT: Conjunctiva and lids normal, oropharynx clear with moist mucosa. Neck: Supple, no elevated JVP or carotid bruits, no thyromegaly. Lungs: Clear to auscultation, nonlabored breathing at rest. Cardiac: Regular rate and rhythm, no S3 or significant systolic murmur, no pericardial rub. Abdomen: Soft, nontender, bowel sounds present, no guarding or rebound. Extremities: No pitting edema, distal pulses 2+. Skin: Warm and dry.  ECG: I personally reviewed the tracing from 06/04/2015 which showed sinus rhythm with LVH, decreased R wave progression, and nonspecific ST-T changes.  Recent Labwork: 01/28/2016: ALT 14; AST 19; BUN 15; Creatinine, Ser 1.11; Platelets 259; Potassium 4.1; Sodium 143   Other Studies Reviewed Today:  Echocardiogram  05/23/2015: Normal LV wall thickness with LVEF 60-65%, mildly dilated right ventricle with normal contraction, mildly dilated right atrium, mild tricuspid and pulmonic regurgitation, PASP 24 mmHg.  Assessment and Plan  1. CAD with occluded nondominant RCA associated with collaterals. He is symptomatically stable on antiplatelet regimen, not requiring nitroglycerin for infrequent angina symptoms. He has elected not to take statin therapy. We will continue with observation.  2. History of diverticulosis and diverticulitis, no reported changes  in stools or obvious bleeding.  Current medicines were reviewed with the patient today.   Orders Placed This Encounter  Procedures  . EKG 12-Lead    Disposition: Follow-up in 6 months.  Signed, Satira Sark, MD, [facility] 07/31/2016 8:31 AM    [place] [facility] [place], [place], [place] [address] Phone: [phone]; Fax: [phone]

## 2016-07-31 ENCOUNTER — Ambulatory Visit (INDEPENDENT_AMBULATORY_CARE_PROVIDER_SITE_OTHER): Payer: Medicare Other | Admitting: Cardiology

## 2016-07-31 ENCOUNTER — Encounter: Payer: Self-pay | Admitting: Cardiology

## 2016-07-31 VITALS — BP 128/77 | HR 64 | Ht 71.0 in | Wt 199.0 lb

## 2016-07-31 DIAGNOSIS — I25119 Atherosclerotic heart disease of native coronary artery with unspecified angina pectoris: Secondary | ICD-10-CM

## 2016-07-31 DIAGNOSIS — Z789 Other specified health status: Secondary | ICD-10-CM

## 2016-07-31 DIAGNOSIS — I2510 Atherosclerotic heart disease of native coronary artery without angina pectoris: Secondary | ICD-10-CM | POA: Insufficient documentation

## 2016-07-31 DIAGNOSIS — I209 Angina pectoris, unspecified: Secondary | ICD-10-CM

## 2016-07-31 NOTE — Patient Instructions (Signed)

## 2016-08-26 ENCOUNTER — Encounter (INDEPENDENT_AMBULATORY_CARE_PROVIDER_SITE_OTHER): Payer: Self-pay | Admitting: Internal Medicine

## 2016-11-16 ENCOUNTER — Ambulatory Visit (INDEPENDENT_AMBULATORY_CARE_PROVIDER_SITE_OTHER): Payer: Medicare Other | Admitting: Internal Medicine

## 2016-11-16 ENCOUNTER — Encounter (INDEPENDENT_AMBULATORY_CARE_PROVIDER_SITE_OTHER): Payer: Self-pay | Admitting: Internal Medicine

## 2016-11-16 VITALS — BP 130/50 | HR 64 | Temp 98.1°F | Ht 71.0 in | Wt 204.9 lb

## 2016-11-16 DIAGNOSIS — R1310 Dysphagia, unspecified: Secondary | ICD-10-CM

## 2016-11-16 DIAGNOSIS — K219 Gastro-esophageal reflux disease without esophagitis: Secondary | ICD-10-CM | POA: Diagnosis not present

## 2016-11-16 NOTE — Patient Instructions (Signed)
OV in 1 year.  

## 2016-11-16 NOTE — Progress Notes (Signed)
Subjective:    Patient ID: Luke Herrera, male    DOB: 1946/04/08, 71 y.o.   MRN: [id number]  HPI Here today for f/u. Hx of GERD.Marland Kitchen He tells me he is doing good.  He is not having any dysphagia. He can eat about what he wants. If he eats dry food, this will give him trouble. He is not a a PPI. Does not desire to take one.  Appetite is good. No weight.  He has a BM x 1. No melena or BRRB     09/21/2014 EGD, ED & Colonoscopy  Indications: Patient is 71 year old Caucasian male who presents with few months history of solid food dysphagia and intermittent heartburn. He also has hematochezia change in caliber of stools and sense of incomplete evacuation. EGD findings;  Erosive reflux esophagitis with high-grade stricture at GE junction which was initially dilated with the scope and then with balloon dilator to 15 mm.  Biopsies taken from mucosa of esophagus to rule out eosinophilic esophagitis. Multiple gastric erosions. Antral biopsy taken for CLOtest. Bulbar duodenitis.  Colonoscopy findings;  Examination performed to cecum. Redundant colon. Multiple diverticula at sigmoid colon but no evidence of colonic stricture. Anal stenosis and small external hemorrhoids.   Review of Systems Past Medical History:  Diagnosis Date  . CAD (coronary artery disease)    Occluded nondominant RCA with right to right collaterals - 05/2015, [facility], [place]   . Diverticulitis   . Diverticulosis   . History of [facility] spotted fever   . NSTEMI (non-ST elevated myocardial infarction) [facility]) October 2016    Past Surgical History:  Procedure Laterality Date  . BALLOON DILATION N/A 09/21/2014   Procedure: BALLOON DILATION;  Surgeon: Rogene Houston, MD;  [facility];  Service: Endoscopy;  Laterality: N/A;  . COLONOSCOPY N/A 09/21/2014   Procedure: COLONOSCOPY;  Surgeon:  Rogene Houston, MD;  [facility];  Service: Endoscopy;  Laterality: N/A;  930  . ESOPHAGOGASTRODUODENOSCOPY N/A 09/21/2014   Procedure: ESOPHAGOGASTRODUODENOSCOPY (EGD);  Surgeon: Rogene Houston, MD;  [facility];  Service: Endoscopy;  Laterality: N/A;  . FLEXIBLE SIGMOIDOSCOPY  10/16/2011   Procedure: FLEXIBLE SIGMOIDOSCOPY;  Surgeon: Rogene Houston, MD;  [facility]: [facility];  Service: Endoscopy;  Laterality: N/A;  930  . HEMORRHOID SURGERY    . TONSILLECTOMY      No Known Allergies  Current Outpatient Prescriptions on File Prior to Visit  Medication Sig Dispense Refill  . aspirin EC 81 MG tablet Take 2 tablets (162 mg total) by mouth daily.    . clopidogrel (PLAVIX) 75 MG tablet Take 1 tablet (75 mg total) by mouth daily. 30 tablet 6  . docusate sodium (COLACE) 100 MG capsule Take 1 capsule (100 mg total) by mouth 2 (two) times daily. 1 capsule 0  . nitroGLYCERIN (NITROSTAT) 0.4 MG SL tablet Place 1 tablet (0.4 mg total) under the tongue every 5 (five) minutes as needed for chest pain. 25 tablet 3  . Probiotic Product (PROBIOTIC ACIDOPHILUS) CAPS Take 1 tablet by mouth daily.     . Wheat Dextrin (BENEFIBER DRINK MIX) PACK Take 4 g by mouth at bedtime.    . Zinc 100 MG TABS Take 50 mg by mouth daily.      No current facility-administered medications on file prior to visit.        Objective:   Physical Exam Blood pressure (!) 130/50, pulse 64, temperature 98.1 F (36.7 C), height  5\' 11"  (1.803 m), weight 204 lb 14.4 oz (92.9 kg). Alert and oriented. Skin warm and dry. Oral mucosa is moist.   . Sclera anicteric, conjunctivae is pink. Thyroid not enlarged. No cervical lymphadenopathy. Lungs clear. Heart regular rate and rhythm.  Abdomen is soft. Bowel sounds are positive. No hepatomegaly. No abdominal masses felt. No tenderness.  No edema to lower extremities.          Assessment & Plan:  GERD. He is doing well. No acid reflux. Dysphagia. He can eat what  he wants. Refuses PPI.

## 2017-01-26 NOTE — Progress Notes (Signed)
Cardiology Office Note  Date: 01/28/2017   ID: Luke Herrera, DOB 25-Nov-1945, MRN [id number]  PCP: Timmothy Euler, MD  Primary Cardiologist: Rozann Lesches, MD   Chief Complaint  Patient presents with  . Coronary Artery Disease    History of Present Illness: Luke Herrera is a 71 y.o. male last seen in December 2017. He presents with his wife for a routine visit. Reports no angina symptoms or nitroglycerin use. Still working outdoors on his property. Reports no decline in stamina.  I reviewed his medications. He has been taking aspirin 325 mg daily, mainly for arthritic pain. No bleeding problems. We did discuss stopping Plavix at this point. He has been taking a fiber supplement. He has declined statin therapy, last LDL was 102.  Past Medical History:  Diagnosis Date  . CAD (coronary artery disease)    Occluded nondominant RCA with right to right collaterals - 05/2015, [facility], [place]   . Diverticulitis   . Diverticulosis   . History of [facility] spotted fever   . NSTEMI (non-ST elevated myocardial infarction) [facility]) October 2016    Past Surgical History:  Procedure Laterality Date  . BALLOON DILATION N/A 09/21/2014   Procedure: BALLOON DILATION;  Surgeon: Rogene Houston, MD;  [facility];  Service: Endoscopy;  Laterality: N/A;  . COLONOSCOPY N/A 09/21/2014   Procedure: COLONOSCOPY;  Surgeon: Rogene Houston, MD;  [facility];  Service: Endoscopy;  Laterality: N/A;  930  . ESOPHAGOGASTRODUODENOSCOPY N/A 09/21/2014   Procedure: ESOPHAGOGASTRODUODENOSCOPY (EGD);  Surgeon: Rogene Houston, MD;  [facility];  Service: Endoscopy;  Laterality: N/A;  . FLEXIBLE SIGMOIDOSCOPY  10/16/2011   Procedure: FLEXIBLE SIGMOIDOSCOPY;  Surgeon: Rogene Houston, MD;  [facility];  Service: Endoscopy;  Laterality: N/A;  930  . HEMORRHOID SURGERY    . TONSILLECTOMY      Current Outpatient Prescriptions    Medication Sig Dispense Refill  . aspirin 325 MG tablet Take 325 mg by mouth daily.    . clopidogrel (PLAVIX) 75 MG tablet Take 1 tablet (75 mg total) by mouth daily. 30 tablet 6  . docusate sodium (COLACE) 100 MG capsule Take 1 capsule (100 mg total) by mouth 2 (two) times daily. 1 capsule 0  . nitroGLYCERIN (NITROSTAT) 0.4 MG SL tablet Place 1 tablet (0.4 mg total) under the tongue every 5 (five) minutes as needed for chest pain. 25 tablet 3  . Probiotic Product (PROBIOTIC ACIDOPHILUS) CAPS Take 1 tablet by mouth daily.     . Wheat Dextrin (BENEFIBER DRINK MIX) PACK Take 4 g by mouth at bedtime.    . Zinc 100 MG TABS Take 50 mg by mouth daily.      No current facility-administered medications for this visit.    Allergies:  Patient has no known allergies.   Social History: The patient  reports that he has never smoked. He has never used smokeless tobacco. He reports that he does not drink alcohol or use drugs.   ROS:  Please see the history of present illness. Otherwise, complete review of systems is positive for arthritic pain and stiffness.  All other systems are reviewed and negative.   Physical Exam: VS:  BP 119/67   Pulse (!) 52   Ht 5\' 11"  (1.803 m)   Wt 201 lb (91.2 kg)   BMI 28.03 kg/m , BMI Body mass index is 28.03 kg/m.  Wt Readings from Last 3 Encounters:  01/28/17 201 lb (91.2 kg)  11/16/16 204 lb 14.4 oz (92.9 kg)  07/31/16 199 lb (90.3 kg)    General: Patient appears comfortable at rest. HEENT: Conjunctiva and lids normal, oropharynx clear with moist mucosa. Neck: Supple, no elevated JVP or carotid bruits, no thyromegaly. Lungs: Clear to auscultation, nonlabored breathing at rest. Cardiac: Regular rate and rhythm, no S3 or significant systolic murmur, no pericardial rub. Abdomen: Soft, nontender, bowel sounds present, no guarding or rebound. Extremities: No pitting edema, distal pulses 2+. Skin: Warm and dry.  ECG: I personally reviewed the tracing from  07/31/2016 which showed sinus rhythm.  Other Studies Reviewed Today:  Echocardiogram 05/23/2015: Normal LV wall thickness with LVEF 60-65%, mildly dilated right ventricle with normal contraction, mildly dilated right atrium, mild tricuspid and pulmonic regurgitation, PASP 24 mmHg.  Assessment and Plan:  1. CAD with occluded nondominant RCA associated with collaterals. He reports no angina symptoms and continue on aspirin with as needed nitroglycerin. We are stopping Plavix at this point.  2. History of mild hyperlipidemia. Declined statin therapy but is using a fiber supplement.  Current medicines were reviewed with the patient today.  Disposition: Follow-up in 6 months.  Signed, Satira Sark, MD, [facility] 01/28/2017 11:48 AM    [place] at [place], [place], [place] [address] Phone: [phone]; Fax: [phone]

## 2017-01-28 ENCOUNTER — Ambulatory Visit (INDEPENDENT_AMBULATORY_CARE_PROVIDER_SITE_OTHER): Payer: Medicare Other | Admitting: Cardiology

## 2017-01-28 ENCOUNTER — Ambulatory Visit: Payer: Medicare Other | Admitting: Cardiology

## 2017-01-28 ENCOUNTER — Encounter: Payer: Self-pay | Admitting: Cardiology

## 2017-01-28 VITALS — BP 119/67 | HR 52 | Ht 71.0 in | Wt 201.0 lb

## 2017-01-28 DIAGNOSIS — I2510 Atherosclerotic heart disease of native coronary artery without angina pectoris: Secondary | ICD-10-CM | POA: Diagnosis not present

## 2017-01-28 DIAGNOSIS — Z789 Other specified health status: Secondary | ICD-10-CM | POA: Diagnosis not present

## 2017-01-28 NOTE — Patient Instructions (Signed)
Medication Instructions:  STOP TAKING PLAVIX BEGIN TAKING ASPIRIN 325 MG ONCE DAILY CONTINUE ALL OTHER MEDICATIONS AS PRESCRIBED  Labwork: NONE  Testing/Procedures: NONE  Follow-Up: Your physician wants you to follow-up in: [place] DR. MCDOWELL. You will receive a reminder letter in the mail two months in advance. If you don't receive a letter, please call our office to schedule the follow-up appointment.  Any Other Special Instructions Will Be Listed Below (If Applicable).  If you need a refill on your cardiac medications before your next appointment, please call your pharmacy.

## 2017-05-02 ENCOUNTER — Encounter (HOSPITAL_COMMUNITY): Payer: Self-pay

## 2017-05-02 ENCOUNTER — Emergency Department (HOSPITAL_COMMUNITY): Payer: Medicare Other

## 2017-05-02 ENCOUNTER — Emergency Department (HOSPITAL_COMMUNITY)
Admission: EM | Admit: 2017-05-02 | Discharge: 2017-05-02 | Disposition: A | Discharge status: Home / Self Care | Payer: Medicare Other | Attending: Emergency Medicine | Admitting: Emergency Medicine

## 2017-05-02 DIAGNOSIS — M79604 Pain in right leg: Secondary | ICD-10-CM | POA: Insufficient documentation

## 2017-05-02 DIAGNOSIS — I249 Acute ischemic heart disease, unspecified: Secondary | ICD-10-CM | POA: Insufficient documentation

## 2017-05-02 DIAGNOSIS — R030 Elevated blood-pressure reading, without diagnosis of hypertension: Secondary | ICD-10-CM | POA: Insufficient documentation

## 2017-05-02 DIAGNOSIS — I252 Old myocardial infarction: Secondary | ICD-10-CM | POA: Insufficient documentation

## 2017-05-02 LAB — BASIC METABOLIC PANEL
Anion gap: 8 (ref 5–15)
BUN: 13 mg/dL (ref 6–20)
CALCIUM: 9 mg/dL (ref 8.9–10.3)
CO2: 26 mmol/L (ref 22–32)
CREATININE: 1.04 mg/dL (ref 0.61–1.24)
Chloride: 108 mmol/L (ref 101–111)
Glucose, Bld: 114 mg/dL — ABNORMAL HIGH (ref 65–99)
Potassium: 4.1 mmol/L (ref 3.5–5.1)
SODIUM: 142 mmol/L (ref 135–145)

## 2017-05-02 LAB — CK: Total CK: 132 U/L (ref 49–397)

## 2017-05-02 NOTE — ED Triage Notes (Signed)
Reports of right lower leg/calf pain since Thursday. States pain increased when taking a step up. Denies injury.

## 2017-05-02 NOTE — ED Provider Notes (Signed)
Emergency Department Provider Note   I have reviewed the triage vital signs and the nursing notes.   HISTORY  Chief Complaint Leg Pain   HPI Luke Herrera is a 71 y.o. male with PMH of CAD and Diverticulosis presents to the emergency department for evaluation of right calf soreness. The patient states that he had a severe cramp in the right calf 3 days ago. He states he typically has some soreness afterwards but has continued to have pain in the leg over the past 3 days which is atypical. He denies any redness or swelling to the leg. Pain is worse with movement and radiates through the entire calf. Denies pain behind the knee or up into the thigh. No numbness or tingling. No additional cramping or injury. The patient states he frequently has lower extremity cramps for unknown reason.   Past Medical History:  Diagnosis Date  . CAD (coronary artery disease)    Occluded nondominant RCA with right to right collaterals - 05/2015, [facility], [place]   . Diverticulitis   . Diverticulosis   . History of [facility] spotted fever   . NSTEMI (non-ST elevated myocardial infarction) [facility]) October 2016    Patient Active Problem List   Diagnosis Date Noted  . Coronary artery disease involving native coronary artery of native heart without angina pectoris 07/31/2016  . Healthcare maintenance 01/28/2016  . Annual physical exam 03/22/2015  . Numbness 03/22/2015  . Tick bite 03/22/2015  . BPH (benign prostatic hyperplasia) 03/22/2015  . Unspecified constipation 07/18/2013  . Fissure in ano 11/09/2011  . Diverticulosis 09/01/2011    Past Surgical History:  Procedure Laterality Date  . BALLOON DILATION N/A 09/21/2014   Procedure: BALLOON DILATION;  Surgeon: Rogene Houston, MD;  [facility];  Service: Endoscopy;  Laterality: N/A;  . COLONOSCOPY N/A 09/21/2014   Procedure: COLONOSCOPY;  Surgeon: Rogene Houston, MD;  [facility];   Service: Endoscopy;  Laterality: N/A;  930  . ESOPHAGOGASTRODUODENOSCOPY N/A 09/21/2014   Procedure: ESOPHAGOGASTRODUODENOSCOPY (EGD);  Surgeon: Rogene Houston, MD;  [facility];  Service: Endoscopy;  Laterality: N/A;  . FLEXIBLE SIGMOIDOSCOPY  10/16/2011   Procedure: FLEXIBLE SIGMOIDOSCOPY;  Surgeon: Rogene Houston, MD;  [facility];  Service: Endoscopy;  Laterality: N/A;  930  . HEMORRHOID SURGERY    . TONSILLECTOMY      Current Outpatient Rx  . Order #: [id number] Class: Historical Med  . Order #: [id number] Class: OTC  . Order #: [id number] Class: Normal  . Order #: [id number] Class: Historical Med  . Order #: [id number] Class: OTC  . Order #: [id number] Class: Historical Med    Allergies Patient has no known allergies.  Family History  Problem Relation Age of Onset  . Healthy Son   . Thyroid disease Daughter   . Cancer Maternal Grandfather   . Anesthesia problems Neg Hx   . Hypotension Neg Hx   . Malignant hyperthermia Neg Hx   . Pseudochol deficiency Neg Hx     Social History Social History  Substance Use Topics  . Smoking status: Never Smoker  . Smokeless tobacco: Never Used  . Alcohol use No    Review of Systems  Constitutional: No fever/chills Eyes: No visual changes. ENT: No sore throat. Cardiovascular: Denies chest pain. Respiratory: Denies shortness of breath. Gastrointestinal: No abdominal pain.  No nausea, no vomiting.  No diarrhea.  No constipation. Genitourinary: Negative for dysuria. Musculoskeletal: Negative for back pain. Positive right calf  pain.  Skin: Negative for rash. Neurological: Negative for headaches, focal weakness or numbness.  10-point ROS otherwise negative.  ____________________________________________   PHYSICAL EXAM:  VITAL SIGNS: ED Triage Vitals [05/02/17 0957]  Enc Vitals Group     BP (!) 155/75     Pulse Rate 63     Resp 16     Temp 97.9 F (36.6 C)     Temp Source Oral     SpO2 96 %     Weight 195 lb  (88.5 kg)     Height 5\' 11"  (1.803 m)   Constitutional: Alert and oriented. Well appearing and in no acute distress. Eyes: Conjunctivae are normal.  Head: Atraumatic. Nose: No congestion/rhinnorhea. Mouth/Throat: Mucous membranes are moist.   Neck: No stridor.  Cardiovascular: Normal rate, regular rhythm. Good peripheral circulation. Grossly normal heart sounds.   Respiratory: Normal respiratory effort.  No retractions. Lungs CTAB. Gastrointestinal: Soft and nontender. No distention.  Musculoskeletal: Mild right calf tenderness to palpation. No appreciable edema. No gross deformities of extremities. Neurologic:  Normal speech and language. No gross focal neurologic deficits are appreciated.  Skin:  Skin is warm, dry and intact. No rash noted.   ____________________________________________   LABS (all labs ordered are listed, but only abnormal results are displayed)  Labs Reviewed  BASIC METABOLIC PANEL - Abnormal; Notable for the following:       Result Value   Glucose, Bld 114 (*)    All other components within normal limits  CK   ____________________________________________  RADIOLOGY  US Venous Img Lower Right (dvt Study)  Result Date: 05/02/2017 CLINICAL DATA:  Right calf pain, 4 days duration. EXAM: Right LOWER EXTREMITY VENOUS DOPPLER ULTRASOUND TECHNIQUE: Gray-scale sonography with graded compression, as well as color Doppler and duplex ultrasound were performed to evaluate the lower extremity deep venous systems from the level of the common femoral vein and including the common femoral, femoral, profunda femoral, popliteal and calf veins including the posterior tibial, peroneal and gastrocnemius veins when visible. The superficial great saphenous vein was also interrogated. Spectral Doppler was utilized to evaluate flow at rest and with distal augmentation maneuvers in the common femoral, femoral and popliteal veins. COMPARISON:  None. FINDINGS: Contralateral Common Femoral  Vein: Respiratory phasicity is normal and symmetric with the symptomatic side. No evidence of thrombus. Normal compressibility. Common Femoral Vein: No evidence of thrombus. Normal compressibility, respiratory phasicity and response to augmentation. Saphenofemoral Junction: No evidence of thrombus. Normal compressibility and flow on color Doppler imaging. Profunda Femoral Vein: No evidence of thrombus. Normal compressibility and flow on color Doppler imaging. Femoral Vein: No evidence of thrombus. Normal compressibility, respiratory phasicity and response to augmentation. Popliteal Vein: No evidence of thrombus. Normal compressibility, respiratory phasicity and response to augmentation. Calf Veins: No evidence of thrombus. Normal compressibility and flow on color Doppler imaging. Superficial Great Saphenous Vein: No evidence of thrombus. Normal compressibility and flow on color Doppler imaging. Venous Reflux:  None. Other Findings:  None. IMPRESSION: No evidence of DVT within the right lower extremity. Electronically Signed   By: Nelson Chimes M.D.   On: 05/02/2017 11:13    ____________________________________________   PROCEDURES  Procedure(s) performed:   Procedures  None ____________________________________________   INITIAL IMPRESSION / ASSESSMENT AND PLAN / ED COURSE  Pertinent labs & imaging results that were available during my care of the patient were reviewed by me and considered in my medical decision making (see chart for details).  atient resents to the emergency department for evaluation  of cramping in the right lower extremity with residual soreness that has lasted for approximately 3 days. There is minimal appreciable swelling on exam. No redness or cellulitis. No warmth to touch. Suspect muscle strain in the setting of cramping but plan for ultrasound of the right lower extremity to evaluate for possible DVT given atypical symptoms.   [place] negative. Electrolytes negative along  with CK. Plan for PCP follow up. Suspect muscle strain as cause of symptoms.   At this time, I do not feel there is any life-threatening condition present. I have reviewed and discussed all results (EKG, imaging, lab, urine as appropriate), exam findings with patient. I have reviewed nursing notes and appropriate previous records.  I feel the patient is safe to be discharged home without further emergent workup. Discussed usual and customary return precautions. Patient and family (if present) verbalize understanding and are comfortable with this plan.  Patient will follow-up with their primary care provider. If they do not have a primary care provider, information for follow-up has been provided to them. All questions have been answered.  ____________________________________________  FINAL CLINICAL IMPRESSION(S) / ED DIAGNOSES  Final diagnoses:  Right leg pain     MEDICATIONS GIVEN DURING THIS VISIT:  Medications - No data to display   NEW OUTPATIENT MEDICATIONS STARTED DURING THIS VISIT:  None   Note:  This document was prepared using Dragon voice recognition software and may include unintentional dictation errors.  Nanda Quinton, MD Emergency Medicine    Doyle Kunath, [facility], MD 05/02/17 2013

## 2017-05-02 NOTE — Discharge Instructions (Signed)

## 2017-07-22 ENCOUNTER — Other Ambulatory Visit: Payer: Self-pay

## 2017-07-28 ENCOUNTER — Ambulatory Visit: Payer: Medicare Other | Admitting: Cardiology

## 2017-09-06 NOTE — Progress Notes (Signed)
Cardiology Office Note  Date: 09/07/2017   ID: Abednego, Yeates [birth year], MRN [id number]  PCP: Timmothy Euler, MD  Primary Cardiologist: Rozann Lesches, MD   Chief Complaint  Patient presents with  . Coronary Artery Disease    History of Present Illness: Luke Herrera is a 72 y.o. male last seen in June 2018. He presents today with his wife for a follow-up visit. Overall, continues to do well without obvious recurring angina symptoms. 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 Reports NYHA class II dyspnea, somewhat more prominent when walking uphill. This has not changed significantly. He has not had to use any nitroglycerin.  We went over his medications which are outlined below. As noted previously, he has not wanted to continue with statin therapy.  I personally reviewed his ECG today which shows sinus rhythm with nonspecific T-wave changes.  Past Medical History:  Diagnosis Date  . CAD (coronary artery disease)    Occluded nondominant RCA with right to right collaterals - 05/2015, [facility], [place]   . Diverticulitis   . Diverticulosis   . History of [facility] spotted fever   . NSTEMI (non-ST elevated myocardial infarction) [facility]) October 2016    Past Surgical History:  Procedure Laterality Date  . BALLOON DILATION N/A 09/21/2014   Procedure: BALLOON DILATION;  Surgeon: Rogene Houston, MD;  [facility];  Service: Endoscopy;  Laterality: N/A;  . COLONOSCOPY N/A 09/21/2014   Procedure: COLONOSCOPY;  Surgeon: Rogene Houston, MD;  [facility];  Service: Endoscopy;  Laterality: N/A;  930  . ESOPHAGOGASTRODUODENOSCOPY N/A 09/21/2014   Procedure: ESOPHAGOGASTRODUODENOSCOPY (EGD);  Surgeon: Rogene Houston, MD;  [facility];  Service: Endoscopy;  Laterality: N/A;  . FLEXIBLE SIGMOIDOSCOPY  10/16/2011   Procedure: FLEXIBLE SIGMOIDOSCOPY;  Surgeon: Rogene Houston, MD;  [facility];  Service: Endoscopy;  Laterality: N/A;  930  . HEMORRHOID SURGERY    . TONSILLECTOMY      Current Outpatient Medications  Medication Sig Dispense Refill  . aspirin 325 MG tablet Take 325 mg by mouth daily.    Marland Kitchen docusate sodium (COLACE) 100 MG capsule Take 1 capsule (100 mg total) by mouth 2 (two) times daily. 1 capsule 0  . NIACIN CR PO Take by mouth.    . nitroGLYCERIN (NITROSTAT) 0.4 MG SL tablet Place 1 tablet (0.4 mg total) under the tongue every 5 (five) minutes as needed for chest pain. 25 tablet 3  . Probiotic Product (PROBIOTIC ACIDOPHILUS) CAPS Take 1 tablet by mouth daily.     . Wheat Dextrin (BENEFIBER DRINK MIX) PACK Take 4 g by mouth at bedtime.    . Zinc 100 MG TABS Take 50 mg by mouth daily.      No current facility-administered medications for this visit.    Allergies:  Patient has no known allergies.   Social History: The patient  reports that  has never smoked. he has never used smokeless tobacco. He reports that he does not drink alcohol or use drugs.   ROS:  Please see the history of present illness. Otherwise, complete review of systems is positive for arthritic symptoms.  All other systems are reviewed and negative.   Physical Exam: VS:  BP 118/64   Pulse 65   Ht 5\' 11"  (1.803 m)   Wt 200 lb (90.7 kg)   SpO2 97%   BMI 27.89 kg/m , BMI Body mass index  is 27.89 kg/m.  Wt Readings from Last 3 Encounters:  09/07/17 200 lb (90.7 kg)  05/02/17 195 lb (88.5 kg)  01/28/17 201 lb (91.2 kg)    General: Patient appears comfortable at rest. HEENT: Conjunctiva and lids normal, oropharynx clear. Neck: Supple, no elevated JVP or carotid bruits, no thyromegaly. Lungs: Clear to auscultation, nonlabored breathing at rest. Cardiac: Regular rate and rhythm, no S3 or significant systolic murmur. Abdomen: Soft, nontender, bowel sounds present. Extremities: No pitting edema, distal pulses 2+. Skin: Warm and dry. Musculoskeletal: No kyphosis. Neuropsychiatric:  Alert and oriented x3, affect grossly appropriate.  ECG: I personally reviewed the tracing from 07/31/2016 which showed sinus rhythm.  Recent Labwork: 05/02/2017: BUN 13; Creatinine, Ser 1.04; Potassium 4.1; Sodium 142   Other Studies Reviewed Today:  Echocardiogram 05/23/2015: Normal LV wall thickness with LVEF 60-65%, mildly dilated right ventricle with normal contraction, mildly dilated right atrium, mild tricuspid and pulmonic regurgitation, PASP 24 mmHg.  Assessment and Plan:  1. Symptomatically stable CAD with history of occluded nondominant RCA associated with collaterals. He reports no progressive angina or nitroglycerin use. Continue with observation.  2. Mild hyperlipidemia. He has declined statin therapy. Watching diet and also on fiber supplement.  Current medicines were reviewed with the patient today.   Orders Placed This Encounter  Procedures  . EKG 12-Lead    Disposition: Follow-up in 6 months.  Signed, Satira Sark, MD, [facility] 09/07/2017 4:04 PM    [facility] [place], [place], [place] [address] Phone: [phone]; Fax: [phone]

## 2017-09-07 ENCOUNTER — Encounter: Payer: Self-pay | Admitting: Cardiology

## 2017-09-07 ENCOUNTER — Ambulatory Visit (INDEPENDENT_AMBULATORY_CARE_PROVIDER_SITE_OTHER): Payer: Medicare Other | Admitting: Cardiology

## 2017-09-07 VITALS — BP 118/64 | HR 65 | Ht 71.0 in | Wt 200.0 lb

## 2017-09-07 DIAGNOSIS — I25119 Atherosclerotic heart disease of native coronary artery with unspecified angina pectoris: Secondary | ICD-10-CM | POA: Diagnosis not present

## 2017-09-07 DIAGNOSIS — E782 Mixed hyperlipidemia: Secondary | ICD-10-CM | POA: Diagnosis not present

## 2017-09-07 DIAGNOSIS — I209 Angina pectoris, unspecified: Secondary | ICD-10-CM

## 2017-09-07 NOTE — Patient Instructions (Signed)

## 2017-09-08 ENCOUNTER — Encounter: Payer: Self-pay | Admitting: Family Medicine

## 2017-09-08 ENCOUNTER — Ambulatory Visit (INDEPENDENT_AMBULATORY_CARE_PROVIDER_SITE_OTHER): Payer: Medicare Other

## 2017-09-08 ENCOUNTER — Ambulatory Visit (INDEPENDENT_AMBULATORY_CARE_PROVIDER_SITE_OTHER): Payer: Medicare Other | Admitting: Family Medicine

## 2017-09-08 VITALS — BP 125/67 | HR 70 | Temp 97.8°F | Ht 71.0 in | Wt 204.4 lb

## 2017-09-08 DIAGNOSIS — R109 Unspecified abdominal pain: Secondary | ICD-10-CM

## 2017-09-08 DIAGNOSIS — I25119 Atherosclerotic heart disease of native coronary artery with unspecified angina pectoris: Secondary | ICD-10-CM

## 2017-09-08 LAB — URINALYSIS, COMPLETE
BILIRUBIN UA: NEGATIVE
Glucose, UA: NEGATIVE
Ketones, UA: NEGATIVE
Leukocytes, UA: NEGATIVE
NITRITE UA: NEGATIVE
PH UA: 6 (ref 5.0–7.5)
Protein, UA: NEGATIVE
RBC UA: NEGATIVE
Specific Gravity, UA: 1.02 (ref 1.005–1.030)
UUROB: 0.2 mg/dL (ref 0.2–1.0)

## 2017-09-08 LAB — MICROSCOPIC EXAMINATION
BACTERIA UA: NONE SEEN
EPITHELIAL CELLS (NON RENAL): NONE SEEN /HPF (ref 0–10)
RBC MICROSCOPIC, UA: NONE SEEN /HPF (ref 0–?)
RENAL EPITHEL UA: NONE SEEN /HPF
WBC UA: NONE SEEN /HPF (ref 0–?)

## 2017-09-08 NOTE — Progress Notes (Signed)
   HPI  Patient presents today here with flank pain  Planes over the last 3 months or so he has had nagging persistent right flank pain.  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  He states that he was riding the tractor a lot and doing lots of heavy lifting and felt like it was likely muscle. He continues to bother him through the winter now.  He denies any characteristics similar to previous kidney stone, he denies any radiation of the pain. No discrete injury. No fever, chills, sweats.  Patient states his bowels are moving normally and it feels different than typical constipation type discomfort  PMH: Smoking status noted ROS: Per HPI  Objective: BP 125/67   Pulse 70   Temp 97.8 F (36.6 C) (Oral)   Ht 5\' 11"  (1.803 m)   Wt 204 lb 6.4 oz (92.7 kg)   BMI 28.51 kg/m  Gen: NAD, alert, cooperative with exam HEENT: NCAT CV: RRR, good S1/S2, no murmur Resp: CTABL, no wheezes, non-labored Ext: No edema, warm Neuro: Alert and oriented, 2+ patellar tendon reflexes bilaterally, negative modified straight leg raise bilaterally  MSK:  No midline tenderness over the lumbar thoracic spine, no tenderness to palpation of the left-sided paraspinal muscles or the right-sided paraspinal muscles,  Assessment and plan:  #Flank pain Unclear etiology, however likely muscular in etiology. Reassurance provided Offered cortisone injection Discussed over-the-counter pain meds Plain film, UA Follow-up if not improving or has additional symptoms. Patient states that he just wanted to make sure there is nothing seriously wrong     Orders Placed This Encounter  Procedures  . DG Lumbar Spine 2-3 Views    Standing Status:   Future    Number of Occurrences:   1    Standing Expiration Date:   11/08/2018    Order Specific Question:   Reason for Exam (SYMPTOM  OR DIAGNOSIS REQUIRED)    Answer:   R sided flank pain    Order Specific  Question:   Preferred imaging location?    Answer:   Internal  . Urinalysis, Complete     Laroy Apple, MD [facility] 09/08/2017, 11:39 AM

## 2017-09-08 NOTE — Patient Instructions (Signed)
Great to see you!  We will call with xray results when they area available  Please let me know if the symptoms get worse or you have other concerns

## 2017-09-20 ENCOUNTER — Telehealth: Payer: Self-pay | Admitting: Family Medicine

## 2017-09-20 NOTE — Telephone Encounter (Signed)
Attempted to contact patient - NVM 

## 2017-09-23 NOTE — Telephone Encounter (Signed)
Pt aware.

## 2017-09-28 ENCOUNTER — Ambulatory Visit (INDEPENDENT_AMBULATORY_CARE_PROVIDER_SITE_OTHER): Payer: Medicare Other | Admitting: Family Medicine

## 2017-09-28 ENCOUNTER — Encounter: Payer: Self-pay | Admitting: Family Medicine

## 2017-09-28 VITALS — BP 120/58 | HR 70 | Temp 96.6°F | Ht 71.0 in | Wt 205.6 lb

## 2017-09-28 DIAGNOSIS — I25119 Atherosclerotic heart disease of native coronary artery with unspecified angina pectoris: Secondary | ICD-10-CM | POA: Diagnosis not present

## 2017-09-28 DIAGNOSIS — Z87442 Personal history of urinary calculi: Secondary | ICD-10-CM | POA: Diagnosis not present

## 2017-09-28 DIAGNOSIS — K5790 Diverticulosis of intestine, part unspecified, without perforation or abscess without bleeding: Secondary | ICD-10-CM

## 2017-09-28 DIAGNOSIS — N281 Cyst of kidney, acquired: Secondary | ICD-10-CM

## 2017-09-28 DIAGNOSIS — I2510 Atherosclerotic heart disease of native coronary artery without angina pectoris: Secondary | ICD-10-CM | POA: Diagnosis not present

## 2017-09-28 DIAGNOSIS — R109 Unspecified abdominal pain: Secondary | ICD-10-CM | POA: Diagnosis not present

## 2017-09-28 NOTE — Patient Instructions (Signed)
Great to see you!  We will work on getting the CT arranged.

## 2017-09-28 NOTE — Addendum Note (Signed)
Addended by: Timmothy Euler on: 09/28/2017 11:24 AM   Modules accepted: Orders, Level of Service

## 2017-09-28 NOTE — Progress Notes (Addendum)
   HPI  Patient presents today with continued flank pain.  Patient states that he is been suffering with this pain now for about 4 months, it started insidiously in September and has continued.  He was seen about 1 month ago with right-sided flank pain.  He denies dysuria, hematuria, or change in bowel habits. He has a history of diverticulosis, renal cysts, and renal stones. The pain is persistent right sided flank pain at the base of his posterior ribs.  It is worse in the morning and worse with movement at times. He has not tried any medications,  PMH: Smoking status noted ROS: Per HPI  Objective: BP (!) 120/58   Pulse 70   Temp (!) 96.6 F (35.9 C) (Oral)   Ht 5\' 11"  (1.803 m)   Wt 205 lb 9.6 oz (93.3 kg)   BMI 28.68 kg/m  Gen: NAD, alert, cooperative with exam HEENT: NCAT CV: RRR, good S1/S2, no murmur Resp: CTABL, no wheezes, non-labored Abd: No tenderness to palpation over the right flank, no guarding, no tenderness to palpation throughout Ext: No edema, warm Neuro: Alert and oriented, No gross deficits  Assessment and plan:  #Right-sided flank pain Patient with history of multiple problems that could be contributing, although the pain is not consistent with typical renal colic I think ruling out renal stone would be helpful. He also has history of diverticulosis as well as renal cysts. Repeat CT, his previous was 2012. Recent renal function has been normal    Orders Placed This Encounter  Procedures  . CT Abdomen Pelvis W Contrast    Standing Status:   Future    Standing Expiration Date:   12/27/2018    Order Specific Question:   If indicated for the ordered procedure, I authorize the administration of contrast media per Radiology protocol    Answer:   Yes    Order Specific Question:   Preferred imaging location?    Answer:   [facility]    Order Specific Question:   Radiology Contrast Protocol - do NOT remove file path    Answer:    [email or web address]     Laroy Apple, MD [facility] 09/28/2017, 11:01 AM   Labs added- CAD stable, checking lipids.   Laroy Apple, MD [facility] 09/28/2017, 11:23 AM

## 2017-09-29 ENCOUNTER — Other Ambulatory Visit: Payer: Medicare Other

## 2017-09-29 DIAGNOSIS — R109 Unspecified abdominal pain: Secondary | ICD-10-CM

## 2017-09-29 DIAGNOSIS — I2510 Atherosclerotic heart disease of native coronary artery without angina pectoris: Secondary | ICD-10-CM | POA: Diagnosis not present

## 2017-09-30 LAB — CMP14+EGFR
ALBUMIN: 4.3 g/dL (ref 3.5–4.8)
ALT: 10 IU/L (ref 0–44)
AST: 13 IU/L (ref 0–40)
Albumin/Globulin Ratio: 1.7 (ref 1.2–2.2)
Alkaline Phosphatase: 70 IU/L (ref 39–117)
BILIRUBIN TOTAL: 0.2 mg/dL (ref 0.0–1.2)
BUN / CREAT RATIO: 11 (ref 10–24)
BUN: 11 mg/dL (ref 8–27)
CALCIUM: 9.2 mg/dL (ref 8.6–10.2)
CHLORIDE: 106 mmol/L (ref 96–106)
CO2: 24 mmol/L (ref 20–29)
CREATININE: 0.96 mg/dL (ref 0.76–1.27)
GFR, EST AFRICAN AMERICAN: 91 mL/min/{1.73_m2} (ref >59)
GFR, EST NON AFRICAN AMERICAN: 79 mL/min/{1.73_m2} (ref >59)
GLUCOSE: 104 mg/dL — AB (ref 65–99)
Globulin, Total: 2.5 g/dL (ref 1.5–4.5)
Potassium: 4.7 mmol/L (ref 3.5–5.2)
Sodium: 147 mmol/L — ABNORMAL HIGH (ref 134–144)
TOTAL PROTEIN: 6.8 g/dL (ref 6.0–8.5)

## 2017-09-30 LAB — CBC WITH DIFFERENTIAL/PLATELET
BASOS ABS: 0.1 10*3/uL (ref 0.0–0.2)
Basos: 1 %
EOS (ABSOLUTE): 0.3 10*3/uL (ref 0.0–0.4)
EOS: 4 %
HEMATOCRIT: 44.7 % (ref 37.5–51.0)
HEMOGLOBIN: 15.2 g/dL (ref 13.0–17.7)
Immature Grans (Abs): 0 10*3/uL (ref 0.0–0.1)
Immature Granulocytes: 1 %
LYMPHS ABS: 1.8 10*3/uL (ref 0.7–3.1)
LYMPHS: 27 %
MCH: 30 pg (ref 26.6–33.0)
MCHC: 34 g/dL (ref 31.5–35.7)
MCV: 88 fL (ref 79–97)
MONOCYTES: 6 %
Monocytes Absolute: 0.4 10*3/uL (ref 0.1–0.9)
NEUTROS ABS: 4.1 10*3/uL (ref 1.4–7.0)
Neutrophils: 61 %
Platelets: 283 10*3/uL (ref 150–379)
RBC: 5.06 x10E6/uL (ref 4.14–5.80)
RDW: 13.5 % (ref 12.3–15.4)
WBC: 6.8 10*3/uL (ref 3.4–10.8)

## 2017-09-30 LAB — LIPID PANEL
CHOLESTEROL TOTAL: 217 mg/dL — AB (ref 100–199)
Chol/HDL Ratio: 6 ratio — ABNORMAL HIGH (ref 0.0–5.0)
HDL: 36 mg/dL — ABNORMAL LOW (ref >39)
LDL CALC: 156 mg/dL — AB (ref 0–99)
TRIGLYCERIDES: 124 mg/dL (ref 0–149)
VLDL CHOLESTEROL CAL: 25 mg/dL (ref 5–40)

## 2017-10-11 ENCOUNTER — Ambulatory Visit (HOSPITAL_COMMUNITY)
Admission: RE | Admit: 2017-10-11 | Discharge: 2017-10-11 | Disposition: A | Discharge status: Home / Self Care | Payer: Medicare Other | Source: Ambulatory Visit | Attending: Family Medicine | Admitting: Family Medicine

## 2017-10-11 DIAGNOSIS — N281 Cyst of kidney, acquired: Secondary | ICD-10-CM | POA: Insufficient documentation

## 2017-10-11 DIAGNOSIS — K449 Diaphragmatic hernia without obstruction or gangrene: Secondary | ICD-10-CM | POA: Insufficient documentation

## 2017-10-11 DIAGNOSIS — D71 Functional disorders of polymorphonuclear neutrophils: Secondary | ICD-10-CM | POA: Insufficient documentation

## 2017-10-11 DIAGNOSIS — I700 Atherosclerosis of aorta: Secondary | ICD-10-CM | POA: Insufficient documentation

## 2017-10-11 DIAGNOSIS — N400 Enlarged prostate without lower urinary tract symptoms: Secondary | ICD-10-CM | POA: Diagnosis not present

## 2017-10-11 DIAGNOSIS — R109 Unspecified abdominal pain: Secondary | ICD-10-CM | POA: Diagnosis not present

## 2017-10-11 DIAGNOSIS — K5730 Diverticulosis of large intestine without perforation or abscess without bleeding: Secondary | ICD-10-CM | POA: Insufficient documentation

## 2017-10-11 MED ORDER — IOPAMIDOL (ISOVUE-300) INJECTION 61%
100.0000 mL | Freq: Once | INTRAVENOUS | Status: AC | PRN
  Administered 2017-10-11: 100 mL via INTRAVENOUS

## 2017-11-17 ENCOUNTER — Ambulatory Visit (INDEPENDENT_AMBULATORY_CARE_PROVIDER_SITE_OTHER): Payer: Medicare Other | Admitting: Internal Medicine

## 2018-01-05 DIAGNOSIS — H2513 Age-related nuclear cataract, bilateral: Secondary | ICD-10-CM | POA: Diagnosis not present

## 2018-01-05 DIAGNOSIS — H40033 Anatomical narrow angle, bilateral: Secondary | ICD-10-CM | POA: Diagnosis not present

## 2018-02-02 DIAGNOSIS — G44219 Episodic tension-type headache, not intractable: Secondary | ICD-10-CM | POA: Diagnosis not present

## 2018-05-09 ENCOUNTER — Encounter (HOSPITAL_COMMUNITY): Payer: Self-pay | Admitting: *Deleted

## 2018-05-09 ENCOUNTER — Emergency Department (HOSPITAL_COMMUNITY)
Admission: EM | Admit: 2018-05-09 | Discharge: 2018-05-09 | Disposition: A | Discharge status: Home / Self Care | Payer: Medicare Other | Attending: Emergency Medicine | Admitting: Emergency Medicine

## 2018-05-09 ENCOUNTER — Other Ambulatory Visit: Payer: Self-pay

## 2018-05-09 ENCOUNTER — Emergency Department (HOSPITAL_COMMUNITY): Payer: Medicare Other

## 2018-05-09 ENCOUNTER — Telehealth: Payer: Self-pay | Admitting: Cardiology

## 2018-05-09 DIAGNOSIS — I493 Ventricular premature depolarization: Secondary | ICD-10-CM | POA: Insufficient documentation

## 2018-05-09 DIAGNOSIS — I2510 Atherosclerotic heart disease of native coronary artery without angina pectoris: Secondary | ICD-10-CM | POA: Diagnosis not present

## 2018-05-09 DIAGNOSIS — Z79899 Other long term (current) drug therapy: Secondary | ICD-10-CM | POA: Diagnosis not present

## 2018-05-09 DIAGNOSIS — Z7982 Long term (current) use of aspirin: Secondary | ICD-10-CM | POA: Insufficient documentation

## 2018-05-09 DIAGNOSIS — R002 Palpitations: Secondary | ICD-10-CM | POA: Diagnosis not present

## 2018-05-09 LAB — CBC
HEMATOCRIT: 46 % (ref 39.0–52.0)
HEMOGLOBIN: 15.6 g/dL (ref 13.0–17.0)
MCH: 31 pg (ref 26.0–34.0)
MCHC: 33.9 g/dL (ref 30.0–36.0)
MCV: 91.5 fL (ref 78.0–100.0)
Platelets: 255 10*3/uL (ref 150–400)
RBC: 5.03 MIL/uL (ref 4.22–5.81)
RDW: 12.9 % (ref 11.5–15.5)
WBC: 8.1 10*3/uL (ref 4.0–10.5)

## 2018-05-09 LAB — BASIC METABOLIC PANEL
Anion gap: 6 (ref 5–15)
BUN: 13 mg/dL (ref 8–23)
CHLORIDE: 108 mmol/L (ref 98–111)
CO2: 27 mmol/L (ref 22–32)
Calcium: 9.1 mg/dL (ref 8.9–10.3)
Creatinine, Ser: 1.04 mg/dL (ref 0.61–1.24)
GFR calc Af Amer: >60 mL/min (ref >60)
GLUCOSE: 109 mg/dL — AB (ref 70–99)
POTASSIUM: 3.7 mmol/L (ref 3.5–5.1)
SODIUM: 141 mmol/L (ref 135–145)

## 2018-05-09 LAB — I-STAT TROPONIN, ED: Troponin i, poc: 0 ng/mL (ref 0.00–0.08)

## 2018-05-09 MED ORDER — POTASSIUM CHLORIDE CRYS ER 20 MEQ PO TBCR: 20.0000 meq | EXTENDED_RELEASE_TABLET | Freq: Two times a day (BID) | ORAL | 0 refills | Status: DC

## 2018-05-09 MED ORDER — POTASSIUM CHLORIDE CRYS ER 20 MEQ PO TBCR
40.0000 meq | EXTENDED_RELEASE_TABLET | Freq: Once | ORAL | Status: AC
  Administered 2018-05-09: 40 meq via ORAL
  Filled 2018-05-09: qty 2

## 2018-05-09 NOTE — Telephone Encounter (Signed)
Patient dsch from [facility] yesterday.  Was instructed to contact office to see [facility].   First available with [facility] was 05/17/18. Patient would like a call back

## 2018-05-09 NOTE — Telephone Encounter (Signed)
Pt agreeable - will forward to schedulers orders placed

## 2018-05-09 NOTE — Discharge Instructions (Addendum)
Return if symptoms are getting worse. °

## 2018-05-09 NOTE — Telephone Encounter (Signed)
Pt says he still having palpitations and skipped beats says no worse than when he was discharged from [facility] yesterday - per discharge summary pt was to f/u with cardiology for possible monitor and given potassium - pt wanted to know if 10/1 appt with Bernerd Pho, PA was soon enough

## 2018-05-09 NOTE — Telephone Encounter (Signed)
°  Precert needed for: Echo & 48 holter   Location: [facility]    Date: Sept 25, 2019

## 2018-05-09 NOTE — Telephone Encounter (Signed)
Records reviewed.  He was seen in the ER and noted to have PVCs but no other arrhythmia.  Troponin I level was normal.  Have him keep that scheduled visit on October 1.  In the meanwhile would suggest a follow-up echocardiogram to ensure LVEF is normal, and also a 48-hour Holter monitor to quantify PVCs and exclude other arrhythmia.

## 2018-05-09 NOTE — ED Provider Notes (Signed)
[facility] EMERGENCY DEPARTMENT Provider Note   CSN: [id number] Arrival date & time: 05/09/18  0028     History   Chief Complaint Chief Complaint  Patient presents with  . Palpitations    HPI Luke Herrera is a 72 y.o. male.  The history is provided by the patient.  Palpitations    He has a history of coronary artery disease, diverticulitis, BPH and comes in with complaints of palpitations for the last 2 days.  He feels like his heart gets slow for a brief period of time, although it can last up to several minutes.  He denies chest pain, heaviness, tightness, pressure.  He denies dyspnea.  He denies nausea, vomiting, diaphoresis.  He has not had problems like this before.  He is on no medications other than daily aspirin.  Past Medical History:  Diagnosis Date  . CAD (coronary artery disease)    Occluded nondominant RCA with right to right collaterals - 05/2015, [facility], [place]   . Diverticulitis   . Diverticulosis   . History of [facility] spotted fever   . NSTEMI (non-ST elevated myocardial infarction) [facility]) October 2016    Patient Active Problem List   Diagnosis Date Noted  . Coronary artery disease involving native coronary artery of native heart without angina pectoris 07/31/2016  . Numbness 03/22/2015  . Tick bite 03/22/2015  . BPH (benign prostatic hyperplasia) 03/22/2015  . Unspecified constipation 07/18/2013  . Fissure in ano 11/09/2011  . Diverticulosis 09/01/2011    Past Surgical History:  Procedure Laterality Date  . BALLOON DILATION N/A 09/21/2014   Procedure: BALLOON DILATION;  Surgeon: Rogene Houston, MD;  Location: [facility] ENDO SUITE;  Service: Endoscopy;  Laterality: N/A;  . COLONOSCOPY N/A 09/21/2014   Procedure: COLONOSCOPY;  Surgeon: Rogene Houston, MD;  Location: [facility] ENDO SUITE;  Service: Endoscopy;  Laterality: N/A;  930  . ESOPHAGOGASTRODUODENOSCOPY N/A 09/21/2014   Procedure: ESOPHAGOGASTRODUODENOSCOPY (EGD);   Surgeon: Rogene Houston, MD;  Location: [facility] ENDO SUITE;  Service: Endoscopy;  Laterality: N/A;  . FLEXIBLE SIGMOIDOSCOPY  10/16/2011   Procedure: FLEXIBLE SIGMOIDOSCOPY;  Surgeon: Rogene Houston, MD;  Location: [facility] ENDO SUITE;  Service: Endoscopy;  Laterality: N/A;  930  . HEMORRHOID SURGERY    . TONSILLECTOMY          Home Medications    Prior to Admission medications   Medication Sig Start Date End Date Taking? Authorizing Provider  aspirin 325 MG tablet Take 325 mg by mouth daily.    [provider]  docusate sodium (COLACE) 100 MG capsule Take 1 capsule (100 mg total) by mouth 2 (two) times daily. 09/12/14   Rehman, Mechele Dawley, MD  NIACIN CR PO Take by mouth.    [provider]  nitroGLYCERIN (NITROSTAT) 0.4 MG SL tablet Place 1 tablet (0.4 mg total) under the tongue every 5 (five) minutes as needed for chest pain. 06/10/15   Satira Sark, MD  Probiotic Product (PROBIOTIC ACIDOPHILUS) CAPS Take 1 tablet by mouth daily.     [provider]  Wheat Dextrin (BENEFIBER DRINK MIX) PACK Take 4 g by mouth at bedtime. 09/12/14   Rogene Houston, MD  Zinc 100 MG TABS Take 50 mg by mouth daily.     [provider]    Family History Family History  Problem Relation Age of Onset  . Healthy Son   . Thyroid disease Daughter   . Cancer Maternal Grandfather   . Anesthesia  problems Neg Hx   . Hypotension Neg Hx   . Malignant hyperthermia Neg Hx   . Pseudochol deficiency Neg Hx     Social History Social History   Tobacco Use  . Smoking status: Never Smoker  . Smokeless tobacco: Never Used  Substance Use Topics  . Alcohol use: No    Alcohol/week: 0.0 standard drinks  . Drug use: No     Allergies   Patient has no known allergies.   Review of Systems Review of Systems  Cardiovascular: Positive for palpitations.  All other systems reviewed and are negative.    Physical Exam Updated Vital Signs BP 128/63   Pulse (!) 57   Temp 97.8 F  (36.6 C) (Oral)   Resp 11   Ht 5\' 11"  (1.803 m)   Wt 86.2 kg   SpO2 94%   BMI 26.50 kg/m   Physical Exam  Nursing note and vitals reviewed.  72 year old male, resting comfortably and in no acute distress. Vital signs are normal. Oxygen saturation is 94%, which is normal. Head is normocephalic and atraumatic. PERRLA, EOMI. Oropharynx is clear. Neck is nontender and supple without adenopathy or JVD. Back is nontender and there is no CVA tenderness. Lungs are clear without rales, wheezes, or rhonchi. Chest is nontender. Heart has regular rate and rhythm without murmur. Abdomen is soft, flat, nontender without masses or hepatosplenomegaly and peristalsis is normoactive. Extremities have no cyanosis or edema, full range of motion is present. Skin is warm and dry without rash. Neurologic: Mental status is normal, cranial nerves are intact, there are no motor or sensory deficits.  ED Treatments / Results  Labs (all labs ordered are listed, but only abnormal results are displayed) Labs Reviewed  BASIC METABOLIC PANEL - Abnormal; Notable for the following components:      Result Value   Glucose, Bld 109 (*)    All other components within normal limits  CBC  I-STAT TROPONIN, ED    EKG EKG Interpretation  Date/Time:  Monday May 09 2018 00:40:34 EDT Ventricular Rate:  56 PR Interval:    QRS Duration: 99 QT Interval:  410 QTC Calculation: 396 R Axis:   -9 Text Interpretation:  Sinus rhythm Prolonged PR interval Left ventricular hypertrophy No old tracing to compare Confirmed by Delora Fuel ([id number]) on 05/09/2018 12:44:48 AM   Radiology Dg Chest 2 View  Result Date: 05/09/2018 CLINICAL DATA:  72 year old male with palpitation. EXAM: CHEST - 2 VIEW COMPARISON:  None. FINDINGS: The heart size and mediastinal contours are within normal limits. Both lungs are clear. The visualized skeletal structures are unremarkable. IMPRESSION: No active cardiopulmonary disease.  Electronically Signed   By: Anner Crete M.D.   On: 05/09/2018 01:26    Procedures Procedures   Medications Ordered in ED Medications  potassium chloride SA (K-DUR,KLOR-CON) CR tablet 40 mEq (40 mEq Oral Given 05/09/18 0152)     Initial Impression / Assessment and Plan / ED Course  I have reviewed the triage vital signs and the nursing notes.  Pertinent labs & imaging results that were available during my care of the patient were reviewed by me and considered in my medical decision making (see chart for details).  Palpitations.  While talking to the patient, PVCs are noted on the monitor.  Symptoms do correlate very well with when a PVC occurs.  I feel this is the cause of his palpitations.  ECG is unremarkable, chest x-ray normal.  Labs are normal, but potassium is on  the lower end of normal at 3.7.  He may benefit from raising the potassium over 4.  He is given a dose of K-Dur in the ED and is discharged with prescription for a 5-day course of K-Dur.  Recommended follow-up with his cardiologist, consider Holter monitor or event monitor to make sure there is not another arrhythmia occurring.  Old records are reviewed, and he is followed by cardiology for coronary artery disease, history of non-STEMI.  Final Clinical Impressions(s) / ED Diagnoses   Final diagnoses:  PVC (premature ventricular contraction)    ED Discharge Orders         Ordered    potassium chloride SA (K-DUR,KLOR-CON) 20 MEQ tablet  2 times daily     05/09/18 4580           Delora Fuel, MD 99/83/38 [phone]

## 2018-05-09 NOTE — ED Triage Notes (Signed)
Pt c/o feeling like his heart is beating fast and then stopping x one day; pt c/o some chest pain

## 2018-05-11 ENCOUNTER — Ambulatory Visit (HOSPITAL_COMMUNITY)
Admission: RE | Admit: 2018-05-11 | Discharge: 2018-05-11 | Disposition: A | Discharge status: Home / Self Care | Payer: Medicare Other | Source: Ambulatory Visit | Attending: Cardiology | Admitting: Cardiology

## 2018-05-11 ENCOUNTER — Ambulatory Visit (HOSPITAL_BASED_OUTPATIENT_CLINIC_OR_DEPARTMENT_OTHER)
Admission: RE | Admit: 2018-05-11 | Discharge: 2018-05-11 | Disposition: A | Discharge status: Home / Self Care | Payer: Medicare Other | Source: Ambulatory Visit | Attending: Cardiology | Admitting: Cardiology

## 2018-05-11 DIAGNOSIS — I252 Old myocardial infarction: Secondary | ICD-10-CM | POA: Insufficient documentation

## 2018-05-11 DIAGNOSIS — I2510 Atherosclerotic heart disease of native coronary artery without angina pectoris: Secondary | ICD-10-CM | POA: Diagnosis not present

## 2018-05-11 DIAGNOSIS — I493 Ventricular premature depolarization: Secondary | ICD-10-CM

## 2018-05-11 NOTE — Progress Notes (Signed)
*  PRELIMINARY RESULTS* Echocardiogram 2D Echocardiogram has been performed.  Luke Herrera 05/11/2018, 12:03 PM

## 2018-05-12 ENCOUNTER — Telehealth: Payer: Self-pay | Admitting: *Deleted

## 2018-05-12 NOTE — Telephone Encounter (Signed)
Wife informed

## 2018-05-12 NOTE — Telephone Encounter (Signed)
-----   Message from Bernita Raisin, RN sent at 05/11/2018  4:36 PM EDT ----- Regarding: Luke Herrera pt   ----- Message ----- From: Satira Sark, MD Sent: 05/11/2018   3:22 PM EDT To: Bernita Raisin, RN  Results reviewed.  Please let him know that LVEF is normal at 55 to 60%, this is reassuring in the setting of PVCs.  Continue with current plan for heart monitor and office follow-up. A copy of this test should be forwarded to Timmothy Euler, MD.

## 2018-05-13 ENCOUNTER — Other Ambulatory Visit (HOSPITAL_COMMUNITY): Payer: Medicare Other

## 2018-05-17 ENCOUNTER — Encounter: Payer: Self-pay | Admitting: *Deleted

## 2018-05-17 ENCOUNTER — Encounter: Payer: Self-pay | Admitting: Student

## 2018-05-17 ENCOUNTER — Other Ambulatory Visit (HOSPITAL_COMMUNITY)
Admission: RE | Admit: 2018-05-17 | Discharge: 2018-05-17 | Disposition: A | Discharge status: Home / Self Care | Payer: Medicare Other | Source: Ambulatory Visit | Attending: Student | Admitting: Student

## 2018-05-17 ENCOUNTER — Ambulatory Visit (INDEPENDENT_AMBULATORY_CARE_PROVIDER_SITE_OTHER): Payer: Medicare Other | Admitting: Student

## 2018-05-17 VITALS — BP 138/68 | HR 70 | Ht 71.0 in | Wt 194.0 lb

## 2018-05-17 DIAGNOSIS — I25119 Atherosclerotic heart disease of native coronary artery with unspecified angina pectoris: Secondary | ICD-10-CM

## 2018-05-17 DIAGNOSIS — I499 Cardiac arrhythmia, unspecified: Secondary | ICD-10-CM

## 2018-05-17 DIAGNOSIS — E782 Mixed hyperlipidemia: Secondary | ICD-10-CM

## 2018-05-17 DIAGNOSIS — I498 Other specified cardiac arrhythmias: Secondary | ICD-10-CM

## 2018-05-17 DIAGNOSIS — R002 Palpitations: Secondary | ICD-10-CM

## 2018-05-17 DIAGNOSIS — R0609 Other forms of dyspnea: Secondary | ICD-10-CM

## 2018-05-17 DIAGNOSIS — R0602 Shortness of breath: Secondary | ICD-10-CM | POA: Insufficient documentation

## 2018-05-17 LAB — BASIC METABOLIC PANEL
ANION GAP: 8 (ref 5–15)
BUN: 11 mg/dL (ref 8–23)
CALCIUM: 9 mg/dL (ref 8.9–10.3)
CO2: 23 mmol/L (ref 22–32)
Chloride: 106 mmol/L (ref 98–111)
Creatinine, Ser: 1.06 mg/dL (ref 0.61–1.24)
GFR calc Af Amer: >60 mL/min (ref >60)
GLUCOSE: 116 mg/dL — AB (ref 70–99)
POTASSIUM: 3.9 mmol/L (ref 3.5–5.1)
Sodium: 137 mmol/L (ref 135–145)

## 2018-05-17 LAB — TSH: TSH: 1.779 u[IU]/mL (ref 0.350–4.500)

## 2018-05-17 LAB — MAGNESIUM: Magnesium: 2.3 mg/dL (ref 1.7–2.4)

## 2018-05-17 NOTE — Progress Notes (Signed)
Cardiology Office Note    Date:  05/17/2018   ID:  MERLAND HOLNESS, DOB 1946-04-27, MRN [id number]  PCP:  System, Provider Not In  Cardiologist: Rozann Lesches, MD    Chief Complaint  Patient presents with  . Follow-up    recent Emergency Dept visit    History of Present Illness:    Vincente A Nephew is a 72 y.o. male with past medical history of CAD (occluded RCA by cath in 05/2015) and HLD who presents to the office today for follow-up from a recent emergency department visit.  He was last examined by Dr. Domenic Polite in 08/2017 and denied any chest pain or dyspnea on exertion at that time. In the interim, he presented to [facility] ED on 05/09/2018 for evaluation of palpitations and his heart skipping. He denied any associated chest discomfort or dyspnea at that time. He was noted to have frequent PVC's on telemetry but no significant arrhythmias. Labs showed WBC 8.1, Hgb 15.6, platelets 255, Na+ 141, K+ 3.7, creatinine 1.04. Initial troponin was negative. EKG showed sinus bradycardia, HR 56, with no acute ST changes when compared to prior tracings. Was prescribed K+ supplementation for 5 days given this was low-normal. ED visit was reviewed with Dr. Domenic Polite and it was recommended he have a follow-up echocardiogram and 48-hour Holter monitor. Echocardiogram showed a preserved EF of 55 to 60% with no regional wall motion abnormalities and trivial MR. His Holter monitor actually resulted earlier today and showed NSR with occasional PVC's and episodes of ventricular bigeminy but no sustained events or significant pauses. PVC's accounted for 1.3% of the total beats.   In talking with the patient and his wife today, he reports having frequent palpitations which led to his ED visit and tells me that he did have episodes of chest discomfort when the palpitations would occur. His pain would only last for a few seconds and spontaneously resolve. During the day of his visit and over the next several  days, he reports having worsening dyspnea but symptoms spontaneously resolved last Thursday. He denies any recurrent palpitations or dyspnea since. No recent illnesses and he denies any changes in his caffeine intake (consumes 3-4 glasses of sweet tea daily). Does not consume alcohol.   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     Past Medical History:  Diagnosis Date  . CAD (coronary artery disease)    Occluded nondominant RCA with right to right collaterals - 05/2015, [facility], [place]   . Diverticulitis   . Diverticulosis   . History of [facility] spotted fever   . NSTEMI (non-ST elevated myocardial infarction) [facility]) October 2016    Past Surgical History:  Procedure Laterality Date  . BALLOON DILATION N/A 09/21/2014   Procedure: BALLOON DILATION;  Surgeon: Rogene Houston, MD;  Location: [facility] ENDO SUITE;  Service: Endoscopy;  Laterality: N/A;  . COLONOSCOPY N/A 09/21/2014   Procedure: COLONOSCOPY;  Surgeon: Rogene Houston, MD;  Location: [facility] ENDO SUITE;  Service: Endoscopy;  Laterality: N/A;  930  . ESOPHAGOGASTRODUODENOSCOPY N/A 09/21/2014   Procedure: ESOPHAGOGASTRODUODENOSCOPY (EGD);  Surgeon: Rogene Houston, MD;  Location: [facility] ENDO [facility];  Service: Endoscopy;  Laterality: N/A;  . FLEXIBLE SIGMOIDOSCOPY  10/16/2011   Procedure: FLEXIBLE SIGMOIDOSCOPY;  Surgeon: Rogene Houston, MD;  Location: [facility] ENDO SUITE;  Service: Endoscopy;  Laterality: N/A;  930  . HEMORRHOID SURGERY    . TONSILLECTOMY      Current Medications: Outpatient Medications Prior to Visit  Medication Sig Dispense Refill  . aspirin 325 MG tablet Take 325 mg by mouth daily.    Marland Kitchen docusate sodium (COLACE) 100 MG capsule Take 1 capsule (100 mg total) by mouth 2 (two) times daily. 1 capsule 0  . NIACIN CR PO Take by mouth.    . nitroGLYCERIN (NITROSTAT) 0.4 MG SL tablet  Place 1 tablet (0.4 mg total) under the tongue every 5 (five) minutes as needed for chest pain. 25 tablet 3  . Probiotic Product (PROBIOTIC ACIDOPHILUS) CAPS Take 1 tablet by mouth daily.     . Wheat Dextrin (BENEFIBER DRINK MIX) PACK Take 4 g by mouth at bedtime.    . Zinc 100 MG TABS Take 50 mg by mouth daily.     . potassium chloride SA (K-DUR,KLOR-CON) 20 MEQ tablet Take 1 tablet (20 mEq total) by mouth 2 (two) times daily. 10 tablet 0   No facility-administered medications prior to visit.      Allergies:   Patient has no known allergies.   Social History   Socioeconomic History  . Marital status: Married    Spouse name: Not on file  . Number of children: Not on file  . Years of education: Not on file  . Highest education level: Not on file  Occupational History  . Not on file  Social Needs  . Financial resource strain: Not on file  . Food insecurity:    Worry: Not on file    Inability: Not on file  . Transportation needs:    Medical: Not on file    Non-medical: Not on file  Tobacco Use  . Smoking status: Never Smoker  . Smokeless tobacco: Never Used  Substance and Sexual Activity  . Alcohol use: No    Alcohol/week: 0.0 standard drinks  . Drug use: No  . Sexual activity: Yes    Birth control/protection: None  Lifestyle  . Physical activity:    Days per week: Not on file    Minutes per session: Not on file  . Stress: Not on file  Relationships  . Social connections:    Talks on phone: Not on file    Gets together: Not on file    Attends religious service: Not on file    Active member of club or organization: Not on file    Attends meetings of clubs or organizations: Not on file    Relationship status: Not on file  Other Topics Concern  . Not on file  Social History Narrative  . Not on file     Family History:  The patient's family history includes Cancer in his maternal grandfather; Healthy in his son; Thyroid disease in his daughter.   Review of Systems:    Please see the history of present illness.     General:  No chills, fever, night sweats or weight changes.  Cardiovascular:  No chest pain, edema, orthopnea, paroxysmal nocturnal dyspnea. Positive for palpitations and dyspnea on exertion.  Dermatological: No rash, lesions/masses Respiratory: No cough, dyspnea Urologic: No hematuria, dysuria Abdominal:   No nausea, vomiting, diarrhea, bright red blood per rectum, melena, or hematemesis Neurologic:  No visual changes, wkns, changes in mental status. All other systems reviewed and are otherwise negative except as noted above.   Physical Exam:    VS:  BP 138/68   Pulse 70  Ht 5\' 11"  (1.803 m)   Wt 194 lb (88 kg)   SpO2 94%   BMI 27.06 kg/m    General: Well developed, well nourished Caucasian male appearing in no acute distress. Head: Normocephalic, atraumatic, sclera non-icteric, no xanthomas, nares are without discharge.  Neck: No carotid bruits. JVD not elevated.  Lungs: Respirations regular and unlabored, without wheezes or rales.  Heart: Regular rate and rhythm with occasional ectopic beats. No S3 or S4.  No murmur, no rubs, or gallops appreciated. Abdomen: Soft, non-tender, non-distended with normoactive bowel sounds. No hepatomegaly. No rebound/guarding. No obvious abdominal masses. Msk:  Strength and tone appear normal for age. No joint deformities or effusions. Extremities: No clubbing or cyanosis. No lower extremity edema.  Distal pedal pulses are 2+ bilaterally. Neuro: Alert and oriented X 3. Moves all extremities spontaneously. No focal deficits noted. Psych:  Responds to questions appropriately with a normal affect. Skin: No rashes or lesions noted  Wt Readings from Last 3 Encounters:  05/17/18 194 lb (88 kg)  05/09/18 190 lb (86.2 kg)  09/28/17 205 lb 9.6 oz (93.3 kg)     Studies/Labs Reviewed:   EKG:  EKG is not ordered today. EKG from 05/09/2018 is reviewed which shows sinus bradycardia, heart rate 56, with no  acute ST changes when compared to prior tracings.  Recent Labs: 09/29/2017: ALT 10 05/09/2018: Hemoglobin 15.6; Platelets 255 05/17/2018: BUN 11; Creatinine, Ser 1.06; Magnesium 2.3; Potassium 3.9; Sodium 137; TSH 1.779   Lipid Panel    Component Value Date/Time   CHOL 217 (H) 09/29/2017 0813   TRIG 124 09/29/2017 0813   HDL 36 (L) 09/29/2017 0813   CHOLHDL 6.0 (H) 09/29/2017 0813   LDLCALC 156 (H) 09/29/2017 0813    Additional studies/ records that were reviewed today include:   Echocardiogram: 05/11/2018 Study Conclusions  - Left ventricle: The cavity size was normal. Wall thickness was at   the upper limits of normal. Systolic function was normal. The   estimated ejection fraction was in the range of 55% to 60%. Wall   motion was normal; there were no regional wall motion   abnormalities. Left ventricular diastolic function parameters   were normal for the patient&'s age. - Mitral valve: There was trivial regurgitation. - Right atrium: Central venous pressure (est): 3 mm Hg. - Atrial septum: No defect or patent foramen ovale was identified. - Tricuspid valve: There was trivial regurgitation. - Pulmonary arteries: PA peak pressure: 19 mm Hg (S). - Pericardium, extracardiac: There was no pericardial effusion.  Holter Monitor: 04/2018 48-hour Holter monitor reviewed.  Sinus rhythm was present throughout.  Heart rate ranged from 46 bpm up to 133 bpm with average heart rate 72 bpm.  There were rare to occasional PVCs noted with some ventricular bigeminy but no sustained events.  No pauses.  PVCs accounted for 1.3% of total beats.     Assessment:    1. Coronary artery disease involving native coronary artery of native heart with angina pectoris ([place])   2. Dyspnea on exertion   3. Palpitations   4. Ventricular bigeminy   5. Mixed hyperlipidemia      Plan:   In order of problems listed above:  1. CAD/ Dyspnea on Exertion - the patient has a known occluded RCA by cath  in 05/2015 and was recently experiencing new onset palpitations with associated chest discomfort at that time. Symptoms spontaneously resolved last week but he does report having dyspnea on exertion for several days during that timeframe.  Reviewed with the patient and his wife today and given that he has not experienced any cardiac symptoms since 2016 until recently, will plan for a Lexiscan Myoview to assess for ischemia.   - He has remained on ASA 325 mg daily and refuses to reduce to 81 mg daily as he has been on this dosing for 30+ years. He is aware of the increased bleeding risk with a higher dose. Also reviewed restarting BB therapy but he does not wish to be on additional medications at this time and experienced fatigue with BB therapy in the past.   2. Palpitations/ Ventricular Bigeminy - He experienced new-onset symptoms starting last week and reports associated chest discomfort and dyspnea at that time. Troponin was negative in the ED and K+ was found to be at the low end of normal.  Symptoms spontaneously resolved last Thursday and he feels back to baseline at this time. - Echocardiogram showed a preserved EF with no regional wall motion abnormalities but his Holter monitor did show episodes of ventricular bigeminy. Will recheck BMET today along with checking TSH and Mg. Will plan for repeat stress testing as outlined above given his arrhythmia and associated symptoms to make sure this was not triggered by ischemia.   3. HLD - Followed by PCP. He has been intolerant to multiple statins and remains on Niacin. Not interested in additional medications at this time.    Medication Adjustments/Labs and Tests Ordered: Current medicines are reviewed at length with the patient today.  Concerns regarding medicines are outlined above.  Medication changes, Labs and Tests ordered today are listed in the Patient Instructions below. Patient Instructions  Medication Instructions:  Your physician  recommends that you continue on your current medications as directed. Please refer to the Current Medication list given to you today.   Labwork: Your physician recommends that you return for lab work today.    Testing/Procedures: Your physician has requested that you have a lexiscan myoview. For further information please visit [email or web address]. Please follow instruction sheet, as given.   Follow-Up: Your physician recommends that you schedule a follow-up appointment in: 2-3 months with Dr. Domenic Polite.   Any Other Special Instructions Will Be Listed Below (If Applicable).  If you need a refill on your cardiac medications before your next appointment, please call your pharmacy.  Thank you for choosing [facility]!    Signed, Erma Heritage, PA-C  05/17/2018 5:31 PM    [facility] [address] [place], [place] [address] Phone: [phone]

## 2018-05-17 NOTE — Patient Instructions (Signed)
Medication Instructions:  Your physician recommends that you continue on your current medications as directed. Please refer to the Current Medication list given to you today.   Labwork: Your physician recommends that you return for lab work today.    Testing/Procedures: Your physician has requested that you have a lexiscan myoview. For further information please visit [email or web address]. Please follow instruction sheet, as given.    Follow-Up: Your physician recommends that you schedule a follow-up appointment in: 2-3 months with Dr. Domenic Polite.    Any Other Special Instructions Will Be Listed Below (If Applicable).     If you need a refill on your cardiac medications before your next appointment, please call your pharmacy.  Thank you for choosing [facility]!

## 2018-05-20 ENCOUNTER — Encounter (HOSPITAL_COMMUNITY)
Admission: RE | Admit: 2018-05-20 | Discharge: 2018-05-20 | Disposition: A | Discharge status: Home / Self Care | Payer: Medicare Other | Source: Ambulatory Visit | Attending: Student | Admitting: Student

## 2018-05-20 ENCOUNTER — Encounter (HOSPITAL_COMMUNITY): Payer: Self-pay

## 2018-05-20 ENCOUNTER — Encounter (HOSPITAL_BASED_OUTPATIENT_CLINIC_OR_DEPARTMENT_OTHER)
Admission: RE | Admit: 2018-05-20 | Discharge: 2018-05-20 | Disposition: A | Discharge status: Home / Self Care | Payer: Medicare Other | Source: Ambulatory Visit | Attending: Student | Admitting: Student

## 2018-05-20 DIAGNOSIS — R0609 Other forms of dyspnea: Secondary | ICD-10-CM | POA: Insufficient documentation

## 2018-05-20 DIAGNOSIS — I499 Cardiac arrhythmia, unspecified: Secondary | ICD-10-CM | POA: Diagnosis not present

## 2018-05-20 DIAGNOSIS — I25119 Atherosclerotic heart disease of native coronary artery with unspecified angina pectoris: Secondary | ICD-10-CM | POA: Insufficient documentation

## 2018-05-20 DIAGNOSIS — R002 Palpitations: Secondary | ICD-10-CM | POA: Insufficient documentation

## 2018-05-20 LAB — NM MYOCAR MULTI W/SPECT W/WALL MOTION / EF
CHL CUP NUCLEAR SDS: 0
CHL CUP RESTING HR STRESS: 52 {beats}/min
CSEPPHR: 76 {beats}/min
LV dias vol: 82 mL (ref 62–150)
LVSYSVOL: 34 mL
NUC STRESS TID: 1.27
RATE: 0.44
SRS: 1
SSS: 1

## 2018-05-20 MED ORDER — SODIUM CHLORIDE 0.9% FLUSH
INTRAVENOUS | Status: AC
  Administered 2018-05-20: 10 mL via INTRAVENOUS
  Filled 2018-05-20: qty 10

## 2018-05-20 MED ORDER — TECHNETIUM TC 99M TETROFOSMIN IV KIT
30.0000 | PACK | Freq: Once | INTRAVENOUS | Status: AC | PRN
  Administered 2018-05-20: 29.8 via INTRAVENOUS

## 2018-05-20 MED ORDER — REGADENOSON 0.4 MG/5ML IV SOLN
INTRAVENOUS | Status: AC
  Administered 2018-05-20: 0.4 mg via INTRAVENOUS
  Filled 2018-05-20: qty 5

## 2018-05-20 MED ORDER — TECHNETIUM TC 99M TETROFOSMIN IV KIT
10.0000 | PACK | Freq: Once | INTRAVENOUS | Status: AC | PRN
  Administered 2018-05-20: 10.8 via INTRAVENOUS

## 2018-05-23 ENCOUNTER — Telehealth: Payer: Self-pay | Admitting: *Deleted

## 2018-05-23 NOTE — Telephone Encounter (Signed)
-----   Message from Erma Heritage, [place] sent at 05/21/2018  5:18 PM EDT ----- Please let the patient know that his stress test was reassuring in that it showed no evidence of ischemia or potential blockage.  Pumping function of the heart was normal. Overall, a low risk study. Thank you.

## 2018-05-23 NOTE — Telephone Encounter (Signed)
Called patient with test results. No answer. Left message to call back.  

## 2018-05-24 ENCOUNTER — Telehealth: Payer: Self-pay | Admitting: *Deleted

## 2018-05-24 NOTE — Telephone Encounter (Signed)
-----   Message from Satira Sark, MD sent at 05/17/2018  1:12 PM EDT ----- Results reviewed.  Cardiac monitor showed sinus rhythm.  There were rare to occasional PVCs with some ventricular bigeminy, but no sustained events and PVCs only representing 1.3% of the total beats.  He has an office visit scheduled soon for review of symptoms.  LVEF was normal by recent echocardiogram.  May need to consider follow-up ischemic work-up if symptoms correlate. A copy of this test should be forwarded to Timmothy Euler, MD.

## 2018-05-24 NOTE — Telephone Encounter (Signed)
Patient informed. Copy sent to PCP °

## 2018-07-28 NOTE — Progress Notes (Signed)
Cardiology Office Note  Date: 07/29/2018   ID: PJ ZEHNER, DOB 08-22-1945, MRN [id number]  PCP: System, Provider Not In  Primary Cardiologist: Rozann Lesches, MD   Chief Complaint  Patient presents with  . Coronary Artery Disease    History of Present Illness: Luke Herrera is a 72 y.o. male last seen in October by Ms. Strader PA-C.  He is here today with his wife for a follow-up visit.  He tells me that he feels better.  He no longer senses PVCs, no angina symptoms at this point.  He has been taking an over-the-counter potassium supplement.  Follow-up Lexiscan Myoview in October of this year was low risk showing no evidence of ischemia and normal LVEF.  I discussed the results with him today.  Past Medical History:  Diagnosis Date  . CAD (coronary artery disease)    Occluded nondominant RCA with right to right collaterals - 05/2015, [facility], [place]   . Diverticulitis   . Diverticulosis   . History of [facility] spotted fever   . NSTEMI (non-ST elevated myocardial infarction) [facility]) October 2016    Past Surgical History:  Procedure Laterality Date  . BALLOON DILATION N/A 09/21/2014   Procedure: BALLOON DILATION;  Surgeon: Rogene Houston, MD;  [facility];  Service: Endoscopy;  Laterality: N/A;  . COLONOSCOPY N/A 09/21/2014   Procedure: COLONOSCOPY;  Surgeon: Rogene Houston, MD;  [facility];  Service: Endoscopy;  Laterality: N/A;  930  . ESOPHAGOGASTRODUODENOSCOPY N/A 09/21/2014   Procedure: ESOPHAGOGASTRODUODENOSCOPY (EGD);  Surgeon: Rogene Houston, MD;  [facility];  Service: Endoscopy;  Laterality: N/A;  . FLEXIBLE SIGMOIDOSCOPY  10/16/2011   Procedure: FLEXIBLE SIGMOIDOSCOPY;  Surgeon: Rogene Houston, MD;  [facility];  Service: Endoscopy;  Laterality: N/A;  930  . HEMORRHOID SURGERY    . TONSILLECTOMY      Current Outpatient Medications  Medication Sig Dispense Refill  .  aspirin 325 MG tablet Take 325 mg by mouth daily.    Marland Kitchen docusate sodium (COLACE) 100 MG capsule Take 1 capsule (100 mg total) by mouth 2 (two) times daily. 1 capsule 0  . NIACIN CR PO Take by mouth.    . nitroGLYCERIN (NITROSTAT) 0.4 MG SL tablet Place 1 tablet (0.4 mg total) under the tongue every 5 (five) minutes as needed for chest pain. 25 tablet 3  . Probiotic Product (PROBIOTIC ACIDOPHILUS) CAPS Take 1 tablet by mouth daily.     . Wheat Dextrin (BENEFIBER DRINK MIX) PACK Take 4 g by mouth at bedtime.    . Zinc 100 MG TABS Take 50 mg by mouth daily.      No current facility-administered medications for this visit.    Allergies:  Patient has no known allergies.   Social History: The patient  reports that he has never smoked. He has never used smokeless tobacco. He reports that he does not drink alcohol or use drugs.   ROS:  Please see the history of present illness. Otherwise, complete review of systems is positive for none.  All other systems are reviewed and negative.   Physical Exam: VS:  BP 124/68   Pulse 64   Ht 5\' 11"  (1.803 m)   Wt 195 lb (88.5 kg)   SpO2 98%   BMI 27.20 kg/m , BMI Body mass index is 27.2 kg/m.  Wt Readings from Last 3 Encounters:  07/29/18 195 lb (88.5 kg)  05/17/18 194  lb (88 kg)  05/09/18 190 lb (86.2 kg)    General: Patient appears comfortable at rest. HEENT: Conjunctiva and lids normal, oropharynx clear. Neck: Supple, no elevated JVP or carotid bruits, no thyromegaly. Lungs: Clear to auscultation, nonlabored breathing at rest. Cardiac: Regular rate and rhythm, no S3 or significant systolic murmur. Abdomen: Soft, nontender, bowel sounds present. Extremities: No pitting edema, distal pulses 2+. Skin: Warm and dry. Musculoskeletal: No kyphosis. Neuropsychiatric: Alert and oriented x3, affect grossly appropriate.  ECG: I personally reviewed the tracing from 05/09/2018 which showed sinus rhythm with prolonged PR interval and increased  voltage.  Recent Labwork: 09/29/2017: ALT 10; AST 13 05/09/2018: Hemoglobin 15.6; Platelets 255 05/17/2018: BUN 11; Creatinine, Ser 1.06; Magnesium 2.3; Potassium 3.9; Sodium 137; TSH 1.779     Component Value Date/Time   CHOL 217 (H) 09/29/2017 0813   TRIG 124 09/29/2017 0813   HDL 36 (L) 09/29/2017 0813   CHOLHDL 6.0 (H) 09/29/2017 0813   LDLCALC 156 (H) 09/29/2017 0813    Other Studies Reviewed Today:  Holter monitor 05/11/2018: 48-hour Holter monitor reviewed.  Sinus rhythm was present throughout.  Heart rate ranged from 46 bpm up to 133 bpm with average heart rate 72 bpm.  There were rare to occasional PVCs noted with some ventricular bigeminy but no sustained events.  No pauses.  PVCs accounted for 1.3% of total beats.  Echocardiogram 05/11/2018: Study Conclusions  - Left ventricle: The cavity size was normal. Wall thickness was at   the upper limits of normal. Systolic function was normal. The   estimated ejection fraction was in the range of 55% to 60%. Wall   motion was normal; there were no regional wall motion   abnormalities. Left ventricular diastolic function parameters   were normal for the patient's age. - Mitral valve: There was trivial regurgitation. - Right atrium: Central venous pressure (est): 3 mm Hg. - Atrial septum: No defect or patent foramen ovale was identified. - Tricuspid valve: There was trivial regurgitation. - Pulmonary arteries: PA peak pressure: 19 mm Hg (S). - Pericardium, extracardiac: There was no pericardial effusion.  Lexiscan Myoview 05/20/2018:  There was no ST segment deviation noted during stress.  The study is normal. There are no perfusion defects consistent with prior infarct or current ischemia.  This is a low risk study.  The left ventricular ejection fraction is normal (55-65%).  Assessment and Plan:  1.  CAD with history of occluded nondominant RCA with right to right collaterals.  He is stable without active angina and had  a recent follow-up Myoview that was low risk.  Continue aspirin (he declines reducing dose to 81 mg daily).  No longer on beta-blocker or statin due to his preference.  2.  History of mild hyperlipidemia.  He has declined statin therapy.  He has been taking a fiber supplement.  Current medicines were reviewed with the patient today.  Disposition: Follow-up in 6 months.  Signed, Satira Sark, MD, [facility] 07/29/2018 9:57 AM    [facility] [place]. [address], [place], [place] [address] Phone: [phone]; Fax: [phone]

## 2018-07-29 ENCOUNTER — Ambulatory Visit (INDEPENDENT_AMBULATORY_CARE_PROVIDER_SITE_OTHER): Payer: Medicare Other | Admitting: Cardiology

## 2018-07-29 ENCOUNTER — Encounter: Payer: Self-pay | Admitting: Cardiology

## 2018-07-29 VITALS — BP 124/68 | HR 64 | Ht 71.0 in | Wt 195.0 lb

## 2018-07-29 DIAGNOSIS — E782 Mixed hyperlipidemia: Secondary | ICD-10-CM | POA: Diagnosis not present

## 2018-07-29 DIAGNOSIS — I493 Ventricular premature depolarization: Secondary | ICD-10-CM | POA: Diagnosis not present

## 2018-07-29 DIAGNOSIS — I25119 Atherosclerotic heart disease of native coronary artery with unspecified angina pectoris: Secondary | ICD-10-CM

## 2018-07-29 NOTE — Patient Instructions (Addendum)

## 2018-09-08 DIAGNOSIS — M79642 Pain in left hand: Secondary | ICD-10-CM | POA: Insufficient documentation

## 2018-10-18 ENCOUNTER — Telehealth: Payer: Self-pay | Admitting: Cardiology

## 2018-10-18 NOTE — Telephone Encounter (Signed)
Patient's wife Marcie Bal) called wanting to know about patient stopping his ASA 325mg   For upcoming surgery. Dr. Amedeo Plenty will be performing the surgery on his hand. States that Dr. Amedeo Plenty is faxing over a form.

## 2018-10-20 NOTE — Telephone Encounter (Signed)
Request sent to Dr. Vanetta Shawl office.

## 2018-10-24 ENCOUNTER — Telehealth: Payer: Self-pay | Admitting: Cardiology

## 2018-10-24 NOTE — Telephone Encounter (Signed)
No records of receiving any clearance or form - LM for surgical scheduler at Dr Amedeo Plenty office

## 2018-10-24 NOTE — Telephone Encounter (Signed)
Patients wife Marcie Bal) called stating that her husband's surgery has been changed to 10-28-2018 due to not hearing about pre-op clearance for upcoming surgery

## 2019-01-26 ENCOUNTER — Telehealth: Payer: Self-pay | Admitting: *Deleted

## 2019-01-26 NOTE — Telephone Encounter (Signed)
Pt contacted per Dr Domenic Polite. History reviewed. No symptoms to suggest any unstable cardiac conditions. Based on discussion, with current pandemic situation, we have postponed 01/31/19 appointment until 03/06/2019. If symptoms change, pt has been instructed to contact our office

## 2019-01-31 ENCOUNTER — Ambulatory Visit: Payer: Medicare Other | Admitting: Cardiology

## 2019-02-02 DIAGNOSIS — M65322 Trigger finger, left index finger: Secondary | ICD-10-CM | POA: Insufficient documentation

## 2019-02-15 IMAGING — DX DG LUMBAR SPINE 2-3V
3 series · 3 of 3 positions shown · non-contrast
Comparison: None.

CLINICAL DATA: Right-sided flank pain

EXAM:
LUMBAR SPINE - 2 VIEW

[l-spine ap (1 of 2)]
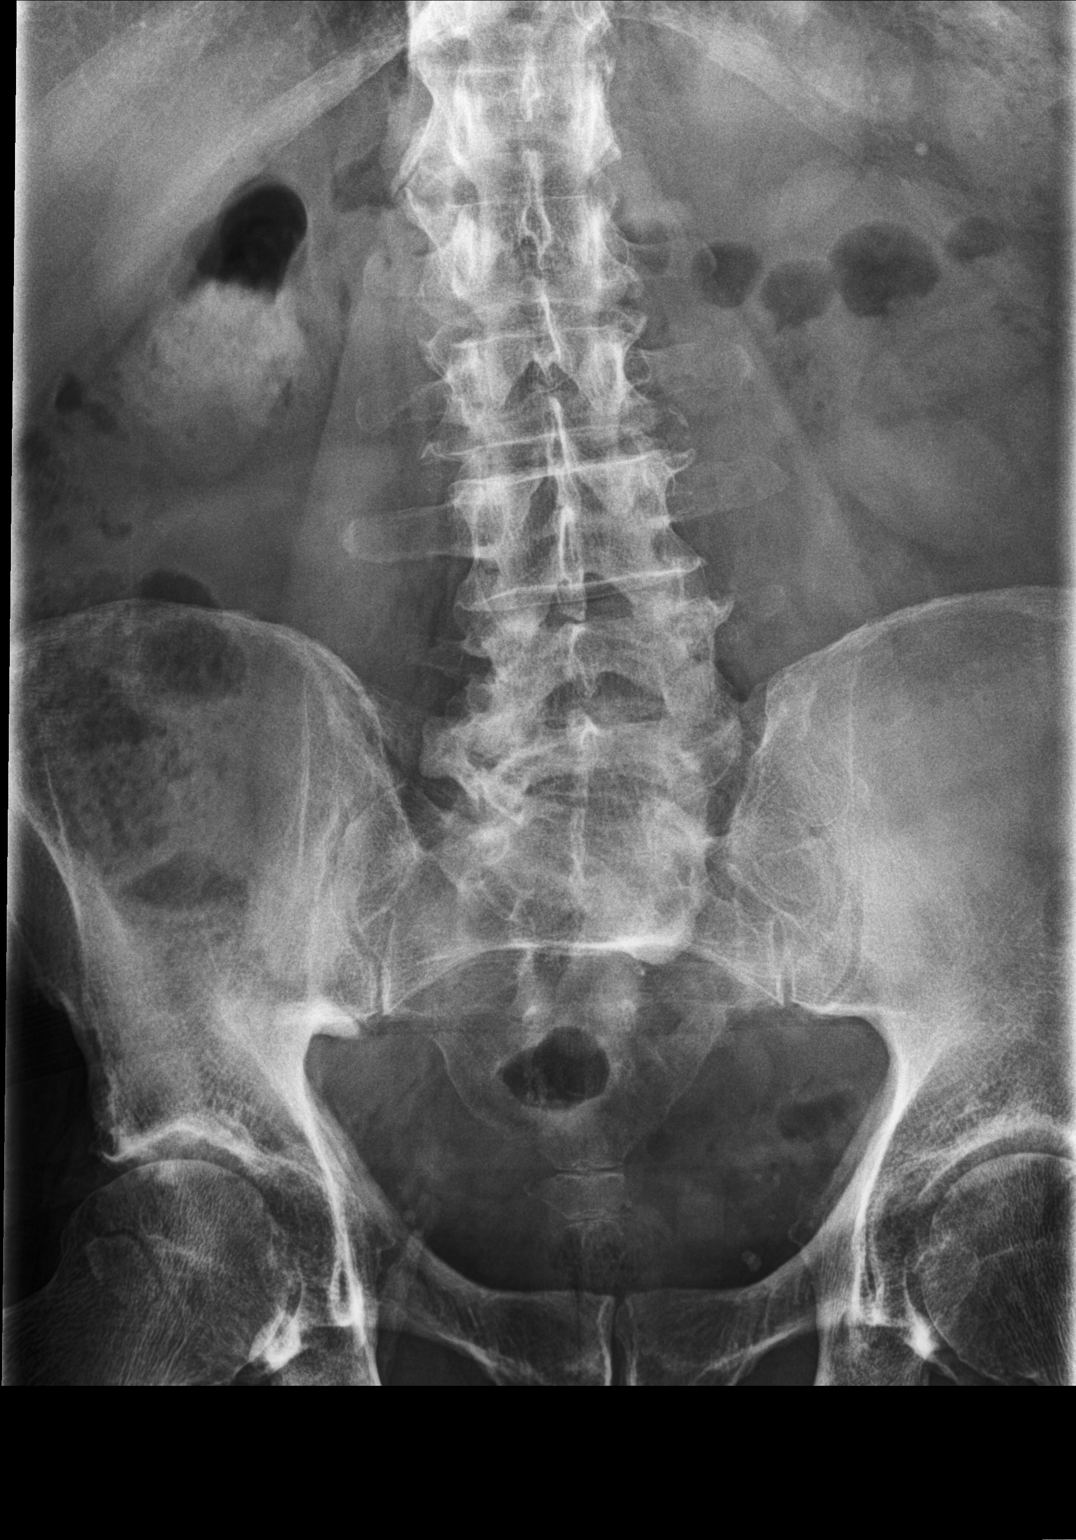

[l-spine lat]
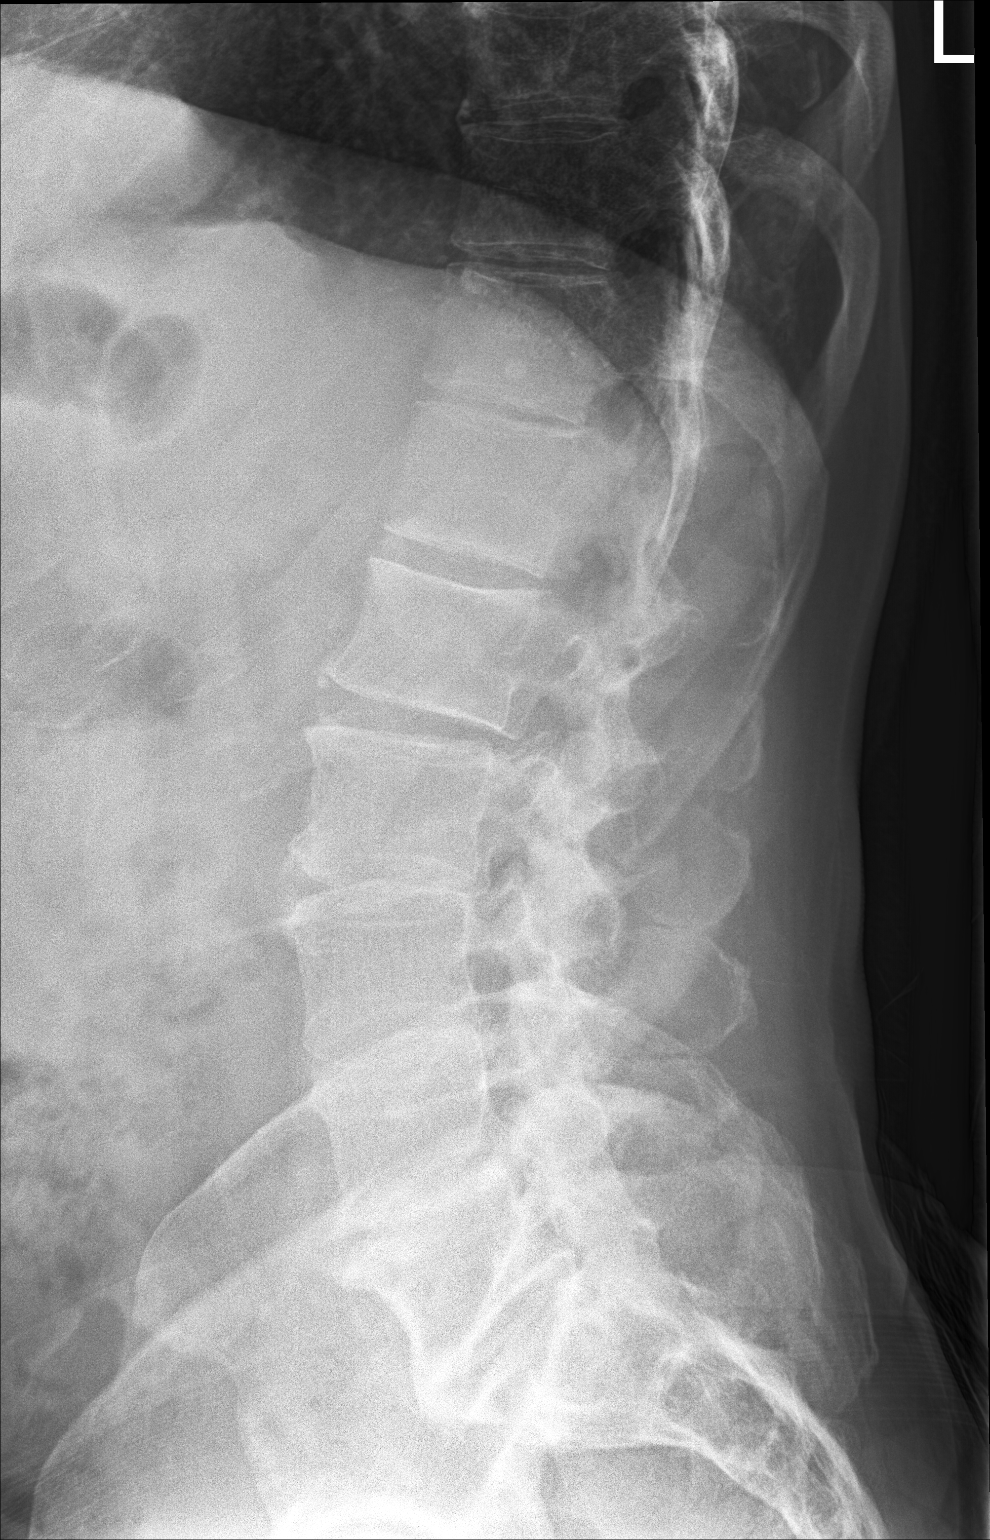

[l-spine ap (2 of 2)]
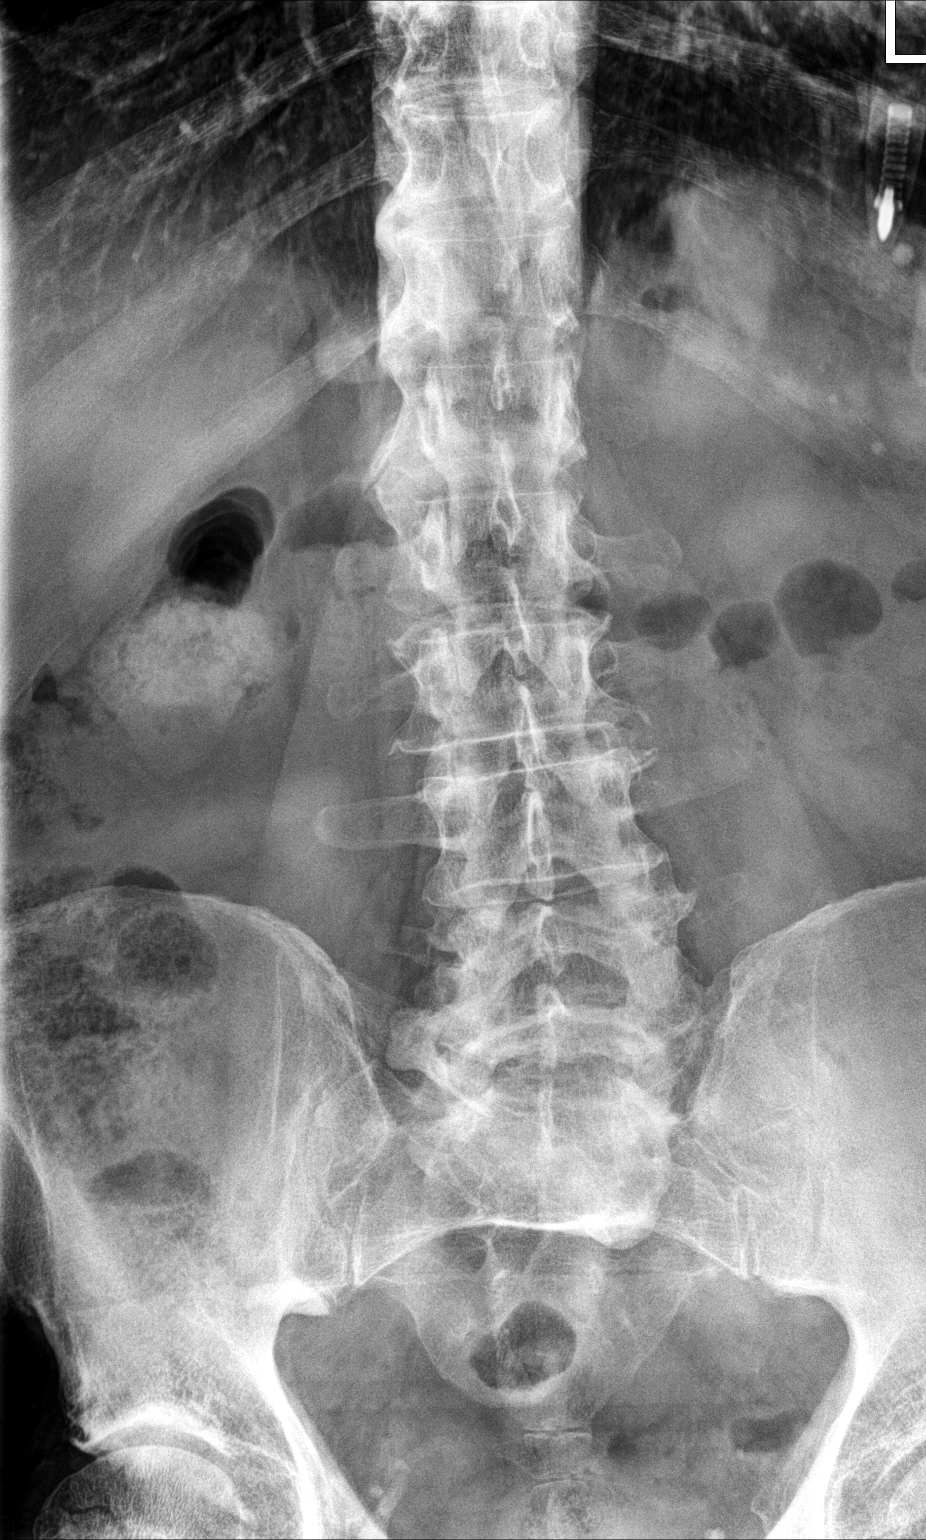

[3 of 3 positions shown; findings below may reference images not displayed]

FINDINGS: Vertebral body height is well maintained. No anterolisthesis is
noted. Multilevel osteophytic changes are seen. No radiopaque
calculi are noted. Mild fecal material within the colon is seen. No
free air is noted. Degenerative changes of the hip joints are noted
bilaterally.
IMPRESSION: Degenerative change without acute abnormality.

## 2019-03-03 ENCOUNTER — Encounter (HOSPITAL_COMMUNITY): Payer: Self-pay | Admitting: Emergency Medicine

## 2019-03-03 ENCOUNTER — Emergency Department (HOSPITAL_COMMUNITY)
Admission: EM | Admit: 2019-03-03 | Discharge: 2019-03-03 | Disposition: A | Discharge status: Home / Self Care | Payer: Medicare Other | Attending: Emergency Medicine | Admitting: Emergency Medicine

## 2019-03-03 ENCOUNTER — Other Ambulatory Visit: Payer: Self-pay

## 2019-03-03 DIAGNOSIS — Z7982 Long term (current) use of aspirin: Secondary | ICD-10-CM | POA: Insufficient documentation

## 2019-03-03 DIAGNOSIS — Z23 Encounter for immunization: Secondary | ICD-10-CM | POA: Diagnosis not present

## 2019-03-03 DIAGNOSIS — W228XXA Striking against or struck by other objects, initial encounter: Secondary | ICD-10-CM | POA: Diagnosis not present

## 2019-03-03 DIAGNOSIS — Z79899 Other long term (current) drug therapy: Secondary | ICD-10-CM | POA: Diagnosis not present

## 2019-03-03 DIAGNOSIS — Y998 Other external cause status: Secondary | ICD-10-CM | POA: Insufficient documentation

## 2019-03-03 DIAGNOSIS — Y9389 Activity, other specified: Secondary | ICD-10-CM | POA: Insufficient documentation

## 2019-03-03 DIAGNOSIS — I2510 Atherosclerotic heart disease of native coronary artery without angina pectoris: Secondary | ICD-10-CM | POA: Insufficient documentation

## 2019-03-03 DIAGNOSIS — I252 Old myocardial infarction: Secondary | ICD-10-CM | POA: Diagnosis not present

## 2019-03-03 DIAGNOSIS — Y929 Unspecified place or not applicable: Secondary | ICD-10-CM | POA: Diagnosis not present

## 2019-03-03 DIAGNOSIS — S61412A Laceration without foreign body of left hand, initial encounter: Secondary | ICD-10-CM | POA: Diagnosis not present

## 2019-03-03 MED ORDER — TETANUS-DIPHTH-ACELL PERTUSSIS 5-2.5-18.5 LF-MCG/0.5 IM SUSP
0.5000 mL | Freq: Once | INTRAMUSCULAR | Status: AC
  Administered 2019-03-03: 0.5 mL via INTRAMUSCULAR
  Filled 2019-03-03: qty 0.5

## 2019-03-03 NOTE — ED Provider Notes (Signed)
[facility] EMERGENCY DEPARTMENT Provider Note   CSN: [id number] Arrival date & time: 03/03/19  1928     History   Chief Complaint Chief Complaint  Patient presents with  . Laceration    hand    HPI Luke Herrera is a 73 y.o. male.     The history is provided by the patient. No language interpreter was used.  Laceration Location:  Hand Hand laceration location:  L hand Length:  3 Depth:  Through dermis Quality: stellate   Bleeding: controlled   Laceration mechanism:  Metal edge Pain details:    Quality:  Aching   Timing:  Constant Relieved by:  Nothing Worsened by:  Nothing Tetanus status:  Out of date  Pt has a skin tear to the top of his hand.  Pt cut while working on a car. Past Medical History:  Diagnosis Date  . CAD (coronary artery disease)    Occluded nondominant RCA with right to right collaterals - 05/2015, [facility], [place]   . Diverticulitis   . Diverticulosis   . History of [facility] spotted fever   . NSTEMI (non-ST elevated myocardial infarction) [facility]) October 2016    Patient Active Problem List   Diagnosis Date Noted  . Shortness of breath 05/17/2018  . Coronary artery disease involving native coronary artery of native heart without angina pectoris 07/31/2016  . Numbness 03/22/2015  . Tick bite 03/22/2015  . BPH (benign prostatic hyperplasia) 03/22/2015  . Unspecified constipation 07/18/2013  . Fissure in ano 11/09/2011  . Diverticulosis 09/01/2011    Past Surgical History:  Procedure Laterality Date  . BALLOON DILATION N/A 09/21/2014   Procedure: BALLOON DILATION;  Surgeon: Rogene Houston, MD;  [facility];  Service: Endoscopy;  Laterality: N/A;  . COLONOSCOPY N/A 09/21/2014   Procedure: COLONOSCOPY;  Surgeon: Rogene Houston, MD;  [facility];  Service: Endoscopy;  Laterality: N/A;  930  . ESOPHAGOGASTRODUODENOSCOPY N/A 09/21/2014   Procedure: ESOPHAGOGASTRODUODENOSCOPY (EGD);   Surgeon: Rogene Houston, MD;  [facility];  Service: Endoscopy;  Laterality: N/A;  . FLEXIBLE SIGMOIDOSCOPY  10/16/2011   Procedure: FLEXIBLE SIGMOIDOSCOPY;  Surgeon: Rogene Houston, MD;  [facility];  Service: Endoscopy;  Laterality: N/A;  930  . HEMORRHOID SURGERY    . TONSILLECTOMY          Home Medications    Prior to Admission medications   Medication Sig Start Date End Date Taking? Authorizing Provider  aspirin 325 MG tablet Take 325 mg by mouth daily.   Yes [provider]  docusate sodium (COLACE) 100 MG capsule Take 1 capsule (100 mg total) by mouth 2 (two) times daily. 09/12/14  Yes Rehman, Mechele Dawley, MD  NIACIN CR PO Take by mouth.   Yes [provider]  nitroGLYCERIN (NITROSTAT) 0.4 MG SL tablet Place 1 tablet (0.4 mg total) under the tongue every 5 (five) minutes as needed for chest pain. 06/10/15  Yes Satira Sark, MD  Potassium 99 MG TABS Take 1 tablet by mouth daily.   Yes [provider]  Wheat Dextrin (BENEFIBER DRINK MIX) PACK Take 4 g by mouth at bedtime. 09/12/14  Yes Rehman, Mechele Dawley, MD  Zinc 100 MG TABS Take 50 mg by mouth daily.    Yes [provider]    Family History Family History  Problem Relation Age of Onset  . Healthy Son   . Thyroid disease Daughter   .  Cancer Maternal Grandfather   . Anesthesia problems Neg Hx   . Hypotension Neg Hx   . Malignant hyperthermia Neg Hx   . Pseudochol deficiency Neg Hx     Social History Social History   Tobacco Use  . Smoking status: Never Smoker  . Smokeless tobacco: Never Used  Substance Use Topics  . Alcohol use: No    Alcohol/week: 0.0 standard drinks  . Drug use: No     Allergies   Patient has no known allergies.   Review of Systems Review of Systems  All other systems reviewed and are negative.    Physical Exam Updated Vital Signs BP 136/75 (BP Location: Right Arm)   Pulse 78   Temp 97.9 F (36.6 C) (Oral)   Resp 16   Ht  5\' 11"  (1.803 m)   Wt 86.2 kg   SpO2 94%   BMI 26.50 kg/m   Physical Exam Vitals signs and nursing note reviewed.  HENT:     Head: Normocephalic.     Mouth/Throat:     Mouth: Mucous membranes are moist.  Eyes:     Pupils: Pupils are equal, round, and reactive to light.  Cardiovascular:     Rate and Rhythm: Normal rate.  Pulmonary:     Effort: Pulmonary effort is normal.  Abdominal:     General: Abdomen is flat.  Musculoskeletal:        General: Tenderness present.  Skin:    Comments: 3cm flap skin tear,  Skin is very thin,  Does not approximate well   Neurological:     General: No focal deficit present.     Mental Status: He is alert.  Psychiatric:        Mood and Affect: Mood normal.      ED Treatments / Results  Labs (all labs ordered are listed, but only abnormal results are displayed) Labs Reviewed - No data to display  EKG None  Radiology No results found.  Procedures .Marland KitchenLaceration Repair  Date/Time: 03/03/2019 8:27 PM Performed by: Fransico Meadow, PA-C Authorized by: Fransico Meadow, PA-C   Consent:    Consent obtained:  Verbal   Consent given by:  Patient   Risks discussed:  Infection, need for additional repair, pain, poor cosmetic result and poor wound healing   Alternatives discussed:  No treatment and delayed treatment Universal protocol:    Procedure explained and questions answered to patient or proxy's satisfaction: yes     Relevant documents present and verified: yes     Test results available and properly labeled: yes     Imaging studies available: yes     Required blood products, implants, devices, and special equipment available: yes     Site/side marked: yes     Immediately prior to procedure, a time out was called: yes     Patient identity confirmed:  Verbally with patient Laceration details:    Length (cm):  3   Depth (mm):  1 Repair type:    Repair type:  Simple Pre-procedure details:    Preparation:  Patient was prepped and  draped in usual sterile fashion and imaging obtained to evaluate for foreign bodies Treatment:    Area cleansed with:  Shur-Clens   Amount of cleaning:  Standard   Irrigation solution:  Sterile saline Skin repair:    Repair method:  Tissue adhesive Approximation:    Approximation:  Close Post-procedure details:    Patient tolerance of procedure:  Tolerated well, no immediate complications   (  including critical care time)  Medications Ordered in ED Medications  Tdap (BOOSTRIX) injection 0.5 mL (0.5 mLs Intramuscular Given 03/03/19 2008)     Initial Impression / Assessment and Plan / ED Course  I have reviewed the triage vital signs and the nursing notes.  Pertinent labs & imaging results that were available during my care of the patient were reviewed by me and considered in my medical decision making (see chart for details).        Pt counseled on thin skin and limited closure options   Final Clinical Impressions(s) / ED Diagnoses   Final diagnoses:  Skin tear of left hand without complication, initial encounter    ED Discharge Orders    None    An After Visit Summary was printed and given to the patient.    Fransico Meadow, Hershal Coria 03/03/19 2028    Francine Graven, DO 03/05/19 1528

## 2019-03-03 NOTE — ED Triage Notes (Signed)
Pt states he was working on a car about 49min ago. Pt reports jack landed on left  hand resulting in laceration

## 2019-03-06 ENCOUNTER — Encounter: Payer: Self-pay | Admitting: Cardiology

## 2019-03-06 ENCOUNTER — Other Ambulatory Visit: Payer: Self-pay

## 2019-03-06 ENCOUNTER — Ambulatory Visit (INDEPENDENT_AMBULATORY_CARE_PROVIDER_SITE_OTHER): Payer: Medicare Other | Admitting: Cardiology

## 2019-03-06 VITALS — BP 120/80 | HR 72 | Temp 98.4°F | Ht 71.0 in | Wt 198.0 lb

## 2019-03-06 DIAGNOSIS — I493 Ventricular premature depolarization: Secondary | ICD-10-CM | POA: Diagnosis not present

## 2019-03-06 DIAGNOSIS — I25119 Atherosclerotic heart disease of native coronary artery with unspecified angina pectoris: Secondary | ICD-10-CM | POA: Diagnosis not present

## 2019-03-06 DIAGNOSIS — E782 Mixed hyperlipidemia: Secondary | ICD-10-CM | POA: Diagnosis not present

## 2019-03-06 MED ORDER — NITROGLYCERIN 0.4 MG SL SUBL: 0.4000 mg | SUBLINGUAL_TABLET | SUBLINGUAL | 3 refills | Status: DC | PRN

## 2019-03-06 NOTE — Patient Instructions (Addendum)
Medication Instructions:   Nitroglycerin refill sent to pharmacy today.   Continue all other current medications.  Labwork: none  Testing/Procedures: none  Follow-Up: Your physician wants you to follow up in: 6 months.  You will receive a reminder letter in the mail one-two months in advance.  If you don't receive a letter, please call our office to schedule the follow up appointment   Any Other Special Instructions Will Be Listed Below (If Applicable).  If you need a refill on your cardiac medications before your next appointment, please call your pharmacy.

## 2019-03-06 NOTE — Progress Notes (Signed)
Cardiology Office Note  Date: 03/06/2019   ID: Cesario, Weidinger 05-02-1946, MRN [id number]  PCP:  [facility]  Cardiologist:  Rozann Lesches, MD Electrophysiologist:  None   Chief Complaint  Patient presents with  . Coronary Artery Disease    History of Present Illness: Jauan Wohl Matera is a 73 y.o. male last seen in December 2019.  He presents for a routine visit.  He does not report any progressive angina symptoms or nitroglycerin use in the interim.  Still enjoys working outdoors when he can.  He reports NYHA class II dyspnea, only an occasional sense of palpitations.  No dizziness or syncope.  I reviewed his medications.  He prefers a fairly simple regimen.  He is on aspirin and has nitroglycerin available.  He has declined beta-blocker therapy and statin therapy.  He does take fiber supplements.  I personally reviewed his ECG today which shows sinus rhythm with low voltage and nonspecific T wave changes.  Past Medical History:  Diagnosis Date  . CAD (coronary artery disease)    Occluded nondominant RCA with right to right collaterals - 05/2015, [facility], [place]   . Diverticulitis   . Diverticulosis   . History of [facility] spotted fever   . NSTEMI (non-ST elevated myocardial infarction) [facility]) October 2016    Past Surgical History:  Procedure Laterality Date  . BALLOON DILATION N/A 09/21/2014   Procedure: BALLOON DILATION;  Surgeon: Rogene Houston, MD;  [facility];  Service: Endoscopy;  Laterality: N/A;  . COLONOSCOPY N/A 09/21/2014   Procedure: COLONOSCOPY;  Surgeon: Rogene Houston, MD;  [facility];  Service: Endoscopy;  Laterality: N/A;  930  . ESOPHAGOGASTRODUODENOSCOPY N/A 09/21/2014   Procedure: ESOPHAGOGASTRODUODENOSCOPY (EGD);  Surgeon: Rogene Houston, MD;  [facility];  Service: Endoscopy;  Laterality: N/A;  . FLEXIBLE SIGMOIDOSCOPY  10/16/2011   Procedure: FLEXIBLE SIGMOIDOSCOPY;  Surgeon:  Rogene Houston, MD;  [facility];  Service: Endoscopy;  Laterality: N/A;  930  . HEMORRHOID SURGERY    . TONSILLECTOMY      Current Outpatient Medications  Medication Sig Dispense Refill  . Ascorbic Acid (VITAMIN C ER PO) Take by mouth.    Marland Kitchen aspirin 325 MG tablet Take 325 mg by mouth daily.    Marland Kitchen docusate sodium (COLACE) 100 MG capsule Take 1 capsule (100 mg total) by mouth 2 (two) times daily. 1 capsule 0  . NIACIN CR PO Take by mouth.    . nitroGLYCERIN (NITROSTAT) 0.4 MG SL tablet Place 1 tablet (0.4 mg total) under the tongue every 5 (five) minutes as needed for chest pain. 25 tablet 3  . Potassium 99 MG TABS Take 1 tablet by mouth daily.    . Turmeric 500 MG TABS Take by mouth.    . Wheat Dextrin (BENEFIBER DRINK MIX) PACK Take 4 g by mouth at bedtime.    . Zinc 100 MG TABS Take 50 mg by mouth daily.      No current facility-administered medications for this visit.    Allergies:  Patient has no known allergies.   Social History: The patient  reports that he has never smoked. He has never used smokeless tobacco. He reports that he does not drink alcohol or use drugs.   ROS:  Please see the history of present illness. Otherwise, complete review of systems is positive for recent skin injury left hand.  All other systems are reviewed and negative.  Physical Exam: VS:  BP 120/80   Pulse 72   Temp 98.4 F (36.9 C)   Ht 5\' 11"  (1.803 m)   Wt 198 lb (89.8 kg)   SpO2 97%   BMI 27.62 kg/m , BMI Body mass index is 27.62 kg/m.  Wt Readings from Last 3 Encounters:  03/06/19 198 lb (89.8 kg)  03/03/19 190 lb (86.2 kg)  07/29/18 195 lb (88.5 kg)    General: Patient appears comfortable at rest. HEENT: Conjunctiva and lids normal, wearing a mask. Neck: Supple, no elevated JVP or carotid bruits, no thyromegaly. Lungs: Clear to auscultation, nonlabored breathing at rest. Cardiac: Regular rate and rhythm, no S3 or significant systolic murmur, no pericardial rub. Abdomen:  Soft, nontender, bowel sounds present. Extremities: No pitting edema, distal pulses 2+. Skin: Warm and dry. Musculoskeletal: No kyphosis. Neuropsychiatric: Alert and oriented x3, affect grossly appropriate.  ECG:  An ECG dated 05/09/2018 was personally reviewed today and demonstrated:  Sinus rhythm with prolonged PR interval and LVH.  Recent Labwork: 05/09/2018: Hemoglobin 15.6; Platelets 255 05/17/2018: BUN 11; Creatinine, Ser 1.06; Magnesium 2.3; Potassium 3.9; Sodium 137; TSH 1.779     Component Value Date/Time   CHOL 217 (H) 09/29/2017 0813   TRIG 124 09/29/2017 0813   HDL 36 (L) 09/29/2017 0813   CHOLHDL 6.0 (H) 09/29/2017 0813   LDLCALC 156 (H) 09/29/2017 0813    Other Studies Reviewed Today:  Holter monitor 05/11/2018: 48-hour Holter monitor reviewed. Sinus rhythm was present throughout. Heart rate ranged from 46 bpm up to 133 bpm with average heart rate 72 bpm. There were rare to occasional PVCs noted with some ventricular bigeminy but no sustained events. No pauses. PVCs accounted for 1.3% of total beats.  Echocardiogram 05/11/2018: Study Conclusions  - Left ventricle: The cavity size was normal. Wall thickness was at the upper limits of normal. Systolic function was normal. The estimated ejection fraction was in the range of 55% to 60%. Wall motion was normal; there were no regional wall motion abnormalities. Left ventricular diastolic function parameters were normal for the patient's age. - Mitral valve: There was trivial regurgitation. - Right atrium: Central venous pressure (est): 3 mm Hg. - Atrial septum: No defect or patent foramen ovale was identified. - Tricuspid valve: There was trivial regurgitation. - Pulmonary arteries: PA peak pressure: 19 mm Hg (S). - Pericardium, extracardiac: There was no pericardial effusion.  Lexiscan Myoview 05/20/2018:  There was no ST segment deviation noted during stress.  The study is normal. There are no  perfusion defects consistent with prior infarct or current ischemia.  This is a low risk study.  The left ventricular ejection fraction is normal (55-65%).  Assessment and Plan:  1.  CAD with known occlusion of the nondominant RCA associated with right to right collaterals.  He reports no progressive angina and continues on aspirin with as needed nitroglycerin.  He declines statin therapy.  ECG reviewed.  2.  Diet managed hyperlipidemia.  He declines a statin therapy.  He is using a fiber supplement at this point.  3.  History of PVCs, continue with observation.  He declines beta-blocker.  Medication Adjustments/Labs and Tests Ordered: Current medicines are reviewed at length with the patient today.  Concerns regarding medicines are outlined above.   Tests Ordered: Orders Placed This Encounter  Procedures  . EKG 12-Lead    Medication Changes: Meds ordered this encounter  Medications  . nitroGLYCERIN (NITROSTAT) 0.4 MG SL tablet    Sig: Place 1 tablet (0.4  mg total) under the tongue every 5 (five) minutes as needed for chest pain.    Dispense:  25 tablet    Refill:  3    Disposition:  Follow up 6 months in the [place] office.  Signed, Satira Sark, MD, [facility] 03/06/2019 9:47 AM    [facility] [place], [place], [place] [address] Phone: [phone]; Fax: [phone]

## 2019-05-24 DIAGNOSIS — G5601 Carpal tunnel syndrome, right upper limb: Secondary | ICD-10-CM

## 2019-05-24 HISTORY — DX: Carpal tunnel syndrome, right upper limb: G56.01

## 2019-06-13 DIAGNOSIS — Z4789 Encounter for other orthopedic aftercare: Secondary | ICD-10-CM | POA: Insufficient documentation

## 2019-10-27 IMAGING — NM NM MYOCAR MULTI W/SPECT W/WALL MOTION & EF
2 series · 12 of 12 positions shown · non-contrast
Comparison: none

[Series 1: rest · 6.51mm/px · 6 of 64 frames shown]
[frame 6/64]
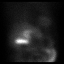
[frame 16/64]
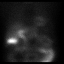
[frame 27/64]
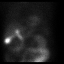
[frame 38/64]
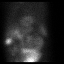
[frame 48/64]
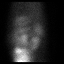
[frame 59/64]
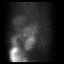

[Series 3: stress gated - perfusion · 6.51mm/px · 6 of 64 frames shown]
[frame 6/64]
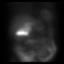
[frame 16/64]
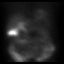
[frame 27/64]
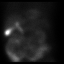
[frame 38/64]
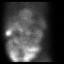
[frame 48/64]
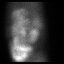
[frame 59/64]
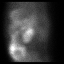

[12 of 12 positions shown; findings below may reference images not displayed]

Canned report from images found in remote index.

Refer to host system for actual result text.

## 2020-01-16 ENCOUNTER — Other Ambulatory Visit: Payer: Self-pay

## 2020-01-16 ENCOUNTER — Ambulatory Visit (INDEPENDENT_AMBULATORY_CARE_PROVIDER_SITE_OTHER): Payer: Medicare Other | Admitting: Cardiology

## 2020-01-16 ENCOUNTER — Encounter: Payer: Self-pay | Admitting: Cardiology

## 2020-01-16 VITALS — BP 126/62 | HR 63 | Ht 71.0 in | Wt 196.0 lb

## 2020-01-16 DIAGNOSIS — I25119 Atherosclerotic heart disease of native coronary artery with unspecified angina pectoris: Secondary | ICD-10-CM | POA: Diagnosis not present

## 2020-01-16 DIAGNOSIS — E782 Mixed hyperlipidemia: Secondary | ICD-10-CM | POA: Diagnosis not present

## 2020-01-16 NOTE — Progress Notes (Signed)
Cardiology Office Note  Date: 01/16/2020   ID: Luke Herrera, DOB [birth year], MRN [id number]  PCP:  System, Provider Not In  Cardiologist:  Rozann Lesches, MD Electrophysiologist:  None   Chief Complaint  Patient presents with   Cardiac follow-up    History of Present Illness: Luke Herrera is a 74 y.o. male last seen in July 2020.  He presents for a routine visit.  Since last evaluation he does not report any progressive angina symptoms or nitroglycerin use.  He occasionally feels a brief sense of palpitations, has prior documented PVCs.  No sudden dizziness or syncope.  He continues to spend time outdoors on his property, states that he walks up to 3 miles over the course of the day.  I reviewed his medications which are outlined below.  He continues on aspirin and has subsequent nitroglycerin available.  He has declined statin therapy, but uses a fiber supplement and turmeric as well as niacin CR.  I personally reviewed his ECG today which shows sinus rhythm with prolonged PR interval and nonspecific T wave changes.  Past Medical History:  Diagnosis Date   CAD (coronary artery disease)    Occluded nondominant RCA with right to right collaterals - 05/2015, [facility], [place]    Diverticulitis    Diverticulosis    History of [facility] spotted fever    NSTEMI (non-ST elevated myocardial infarction) [facility]) October 2016    Past Surgical History:  Procedure Laterality Date   BALLOON DILATION N/A 09/21/2014   Procedure: BALLOON DILATION;  Surgeon: Rogene Houston, MD;  [facility];  Service: Endoscopy;  Laterality: N/A;   COLONOSCOPY N/A 09/21/2014   Procedure: COLONOSCOPY;  Surgeon: Rogene Houston, MD;  [facility];  Service: Endoscopy;  Laterality: N/A;  930   ESOPHAGOGASTRODUODENOSCOPY N/A 09/21/2014   Procedure: ESOPHAGOGASTRODUODENOSCOPY (EGD);  Surgeon: Rogene Houston, MD;  [facility];   Service: Endoscopy;  Laterality: N/A;   FLEXIBLE SIGMOIDOSCOPY  10/16/2011   Procedure: FLEXIBLE SIGMOIDOSCOPY;  Surgeon: Rogene Houston, MD;  [facility];  Service: Endoscopy;  Laterality: N/A;  930   HEMORRHOID SURGERY     TONSILLECTOMY      Current Outpatient Medications  Medication Sig Dispense Refill   Ascorbic Acid (VITAMIN C ER PO) Take by mouth.     aspirin 325 MG tablet Take 325 mg by mouth daily.     docusate sodium (COLACE) 100 MG capsule Take 1 capsule (100 mg total) by mouth 2 (two) times daily. 1 capsule 0   NIACIN CR PO Take by mouth.     nitroGLYCERIN (NITROSTAT) 0.4 MG SL tablet Place 1 tablet (0.4 mg total) under the tongue every 5 (five) minutes as needed for chest pain. 25 tablet 3   Potassium 99 MG TABS Take 1 tablet by mouth daily.     Turmeric 500 MG TABS Take by mouth.     Wheat Dextrin (BENEFIBER DRINK MIX) PACK Take 4 g by mouth at bedtime.     Zinc 100 MG TABS Take 50 mg by mouth daily.      No current facility-administered medications for this visit.   Allergies:  Patient has no known allergies.   ROS:  Arthritic hand pains.  Physical Exam: VS:  BP 126/62    Pulse 63    Ht 5\' 11"  (1.803 m)    Wt 196 lb (88.9 kg)    SpO2 96%  BMI 27.34 kg/m , BMI Body mass index is 27.34 kg/m.  Wt Readings from Last 3 Encounters:  01/16/20 196 lb (88.9 kg)  03/06/19 198 lb (89.8 kg)  03/03/19 190 lb (86.2 kg)    General: Patient appears comfortable at rest. HEENT: Conjunctiva and lids normal, wearing a mask. Neck: Supple, no elevated JVP or carotid bruits, no thyromegaly. Lungs: Clear to auscultation, nonlabored breathing at rest. Cardiac: Regular rate and rhythm, no S3 or significant systolic murmur, no pericardial rub. Extremities: No pitting edema, distal pulses 2+.  ECG:  An ECG dated 03/06/2019 was personally reviewed today and demonstrated:  Sinus rhythm with low voltage and nonspecific T wave changes.  Recent Labwork:       Component Value Date/Time   CHOL 217 (H) 09/29/2017 0813   TRIG 124 09/29/2017 0813   HDL 36 (L) 09/29/2017 0813   CHOLHDL 6.0 (H) 09/29/2017 0813   LDLCALC 156 (H) 09/29/2017 0813    Other Studies Reviewed Today:  Holter monitor 05/11/2018: 48-hour Holter monitor reviewed. Sinus rhythm was present throughout. Heart rate ranged from 46 bpm up to 133 bpm with average heart rate 72 bpm. There were rare to occasional PVCs noted with some ventricular bigeminy but no sustained events. No pauses. PVCs accounted for 1.3% of total beats.  Echocardiogram 05/11/2018: Study Conclusions  - Left ventricle: The cavity size was normal. Wall thickness was at the upper limits of normal. Systolic function was normal. The estimated ejection fraction was in the range of 55% to 60%. Wall motion was normal; there were no regional wall motion abnormalities. Left ventricular diastolic function parameters were normal for the patient's age. - Mitral valve: There was trivial regurgitation. - Right atrium: Central venous pressure (est): 3 mm Hg. - Atrial septum: No defect or patent foramen ovale was identified. - Tricuspid valve: There was trivial regurgitation. - Pulmonary arteries: PA peak pressure: 19 mm Hg (S). - Pericardium, extracardiac: There was no pericardial effusion.  Lexiscan Myoview 05/20/2018:  There was no ST segment deviation noted during stress.  The study is normal. There are no perfusion defects consistent with prior infarct or current ischemia.  This is a low risk study.  The left ventricular ejection fraction is normal (55-65%).  Assessment and Plan:  1.  CAD with history of occluded nondominant RCA associated with right to right collaterals.  We continue medical therapy and observation in the absence of progressive angina symptoms.  ECG reviewed today.  Continue aspirin and as needed nitroglycerin.  2.  Diet managed mixed hyperlipidemia.  He declined statin  therapy and is currently on a fiber supplement, turmeric, and niacin CR.  3.  Intermittent, brief palpitations with previously documented PVCs.  LVEF normal by prior assessment, no syncope.  Continue observation.  Medication Adjustments/Labs and Tests Ordered: Current medicines are reviewed at length with the patient today.  Concerns regarding medicines are outlined above.   Tests Ordered: Orders Placed This Encounter  Procedures   EKG 12-Lead    Medication Changes: No orders of the defined types were placed in this encounter.   Disposition:  Follow up 1 year in the [place] office.  Signed, Satira Sark, MD, [facility] 01/16/2020 8:35 AM    [place] [facility] [place], [place],  [address] Phone: [phone]; Fax: [phone]

## 2020-01-16 NOTE — Patient Instructions (Addendum)

## 2020-07-09 ENCOUNTER — Other Ambulatory Visit: Payer: Self-pay

## 2020-07-09 ENCOUNTER — Encounter: Payer: Self-pay | Admitting: Family Medicine

## 2020-07-09 ENCOUNTER — Ambulatory Visit (INDEPENDENT_AMBULATORY_CARE_PROVIDER_SITE_OTHER): Payer: Medicare Other | Admitting: Family Medicine

## 2020-07-09 VITALS — BP 138/65 | HR 63 | Temp 97.5°F | Resp 20 | Ht 71.0 in | Wt 188.0 lb

## 2020-07-09 DIAGNOSIS — Z125 Encounter for screening for malignant neoplasm of prostate: Secondary | ICD-10-CM

## 2020-07-09 DIAGNOSIS — Z0001 Encounter for general adult medical examination with abnormal findings: Secondary | ICD-10-CM | POA: Diagnosis not present

## 2020-07-09 DIAGNOSIS — I2510 Atherosclerotic heart disease of native coronary artery without angina pectoris: Secondary | ICD-10-CM

## 2020-07-09 NOTE — Progress Notes (Signed)
Subjective:  Patient ID: Luke Herrera, male    DOB: 08/25/1945  Age: 74 y.o. MRN: [id number]  CC: Establish Care   HPI Luke Herrera presents for CPE. No complaints, but his cardiologist wants him to have a PCP.   Depression screen Luke Herrera 2/9 07/09/2020 09/28/2017 09/08/2017  Decreased Interest 0 0 0  Down, Depressed, Hopeless 0 0 0  PHQ - 2 Score 0 0 0    History Luke Herrera has a past medical history of CAD (coronary artery disease), Diverticulitis, Diverticulosis, History of [facility] spotted fever, and NSTEMI (non-ST elevated myocardial infarction) [facility]) (October 2016).   He has a past surgical history that includes Tonsillectomy; Hemorrhoid surgery; Flexible sigmoidoscopy (10/16/2011); Colonoscopy (N/A, 09/21/2014); Esophagogastroduodenoscopy (N/A, 09/21/2014); and Balloon dilation (N/A, 09/21/2014).   His family history includes Cancer in his maternal grandfather; Healthy in his son; Thyroid disease in his daughter.He reports that he has never smoked. He has never used smokeless tobacco. He reports that he does not drink alcohol and does not use drugs.    ROS Review of Systems  Constitutional: Negative.   HENT: Negative.   Eyes: Negative for visual disturbance.  Respiratory: Negative for cough and shortness of breath.   Cardiovascular: Negative for chest pain and leg swelling.  Gastrointestinal: Negative for abdominal pain, diarrhea, nausea and vomiting.  Genitourinary: Negative for difficulty urinating.  Musculoskeletal: Negative for arthralgias and myalgias.  Skin: Negative for rash.  Neurological: Negative for headaches.  Psychiatric/Behavioral: Negative for sleep disturbance.    Objective:  BP 138/65   Pulse 63   Temp (!) 97.5 F (36.4 C) (Temporal)   Resp 20   Ht 5\' 11"  (1.803 m)   Wt 188 lb (85.3 kg)   SpO2 95%   BMI 26.22 kg/m   BP Readings from Last 3 Encounters:  07/09/20 138/65  01/16/20 126/62  03/06/19 120/80    Wt Readings from Last 3  Encounters:  07/09/20 188 lb (85.3 kg)  01/16/20 196 lb (88.9 kg)  03/06/19 198 lb (89.8 kg)     Physical Exam Constitutional:      Appearance: He is well-developed.  HENT:     Head: Normocephalic and atraumatic.  Eyes:     Pupils: Pupils are equal, round, and reactive to light.  Neck:     Thyroid: No thyromegaly.     Trachea: No tracheal deviation.  Cardiovascular:     Rate and Rhythm: Normal rate and regular rhythm.     Heart sounds: Normal heart sounds. No murmur heard.  No friction rub. No gallop.   Pulmonary:     Breath sounds: Normal breath sounds. No wheezing or rales.  Abdominal:     General: Bowel sounds are normal. There is no distension.     Palpations: Abdomen is soft. There is no mass.     Tenderness: There is no abdominal tenderness.     Hernia: There is no hernia in the left inguinal area.  Genitourinary:    Penis: Normal.      Testes: Normal.  Musculoskeletal:        General: Normal range of motion.     Cervical back: Normal range of motion.  Lymphadenopathy:     Cervical: No cervical adenopathy.  Skin:    General: Skin is warm and dry.  Neurological:     Mental Status: He is alert and oriented to person, place, and time.       Assessment & Plan:   Luke Herrera was seen today for establish  care.  Diagnoses and all orders for this visit:  Coronary artery disease involving native coronary artery of native heart without angina pectoris -     EKG 12-Lead       I have discontinued Luke Herrera's docusate sodium. I am also having him maintain his Zinc, Benefiber Drink Mix, aspirin, NIACIN CR PO, Potassium, Turmeric, Ascorbic Acid (VITAMIN C ER PO), nitroGLYCERIN, magnesium, and pyridOXINE.  Allergies as of 07/09/2020   No Known Allergies     Medication List       Accurate as of July 09, 2020  4:26 PM. If you have any questions, ask your nurse or doctor.        STOP taking these medications   docusate sodium 100 MG  capsule Commonly known as: Colace Stopped by: Luke Fraise, MD     TAKE these medications   aspirin 325 MG tablet Take 325 mg by mouth daily.   Benefiber Drink Mix Pack Take 4 g by mouth at bedtime.   magnesium 30 MG tablet Take 30 mg by mouth 2 (two) times daily.   NIACIN CR PO Take by mouth.   nitroGLYCERIN 0.4 MG SL tablet Commonly known as: NITROSTAT Place 1 tablet (0.4 mg total) under the tongue every 5 (five) minutes as needed for chest pain.   Potassium 99 MG Tabs Take 1 tablet by mouth daily.   pyridOXINE 100 MG tablet Commonly known as: VITAMIN B-6 Take 100 mg by mouth daily.   Turmeric 500 MG Tabs Take by mouth.   VITAMIN C ER PO Take by mouth.   Zinc 100 MG Tabs Take 50 mg by mouth daily.        Follow-up: No follow-ups on file.  Luke Herrera, M.D.

## 2020-07-10 LAB — CMP14+EGFR
ALT: 23 IU/L (ref 0–44)
AST: 26 IU/L (ref 0–40)
Albumin/Globulin Ratio: 1.5 (ref 1.2–2.2)
Albumin: 4.1 g/dL (ref 3.7–4.7)
Alkaline Phosphatase: 80 IU/L (ref 44–121)
BUN/Creatinine Ratio: 9 — ABNORMAL LOW (ref 10–24)
BUN: 9 mg/dL (ref 8–27)
Bilirubin Total: 0.2 mg/dL (ref 0.0–1.2)
CO2: 26 mmol/L (ref 20–29)
Calcium: 9.3 mg/dL (ref 8.6–10.2)
Chloride: 106 mmol/L (ref 96–106)
Creatinine, Ser: 1.03 mg/dL (ref 0.76–1.27)
GFR calc Af Amer: 82 mL/min/{1.73_m2} (ref >59)
GFR calc non Af Amer: 71 mL/min/{1.73_m2} (ref >59)
Globulin, Total: 2.7 g/dL (ref 1.5–4.5)
Glucose: 108 mg/dL — ABNORMAL HIGH (ref 65–99)
Potassium: 4 mmol/L (ref 3.5–5.2)
Sodium: 140 mmol/L (ref 134–144)
Total Protein: 6.8 g/dL (ref 6.0–8.5)

## 2020-07-10 LAB — CBC WITH DIFFERENTIAL/PLATELET
Basophils Absolute: 0 10*3/uL (ref 0.0–0.2)
Basos: 0 %
EOS (ABSOLUTE): 0.2 10*3/uL (ref 0.0–0.4)
Eos: 3 %
Hematocrit: 43.9 % (ref 37.5–51.0)
Hemoglobin: 14.9 g/dL (ref 13.0–17.7)
Immature Grans (Abs): 0 10*3/uL (ref 0.0–0.1)
Immature Granulocytes: 0 %
Lymphocytes Absolute: 2.1 10*3/uL (ref 0.7–3.1)
Lymphs: 28 %
MCH: 30.5 pg (ref 26.6–33.0)
MCHC: 33.9 g/dL (ref 31.5–35.7)
MCV: 90 fL (ref 79–97)
Monocytes Absolute: 0.5 10*3/uL (ref 0.1–0.9)
Monocytes: 7 %
Neutrophils Absolute: 4.6 10*3/uL (ref 1.4–7.0)
Neutrophils: 62 %
Platelets: 269 10*3/uL (ref 150–450)
RBC: 4.88 x10E6/uL (ref 4.14–5.80)
RDW: 12.2 % (ref 11.6–15.4)
WBC: 7.6 10*3/uL (ref 3.4–10.8)

## 2020-07-10 LAB — LIPID PANEL
Chol/HDL Ratio: 5.1 ratio — ABNORMAL HIGH (ref 0.0–5.0)
Cholesterol, Total: 199 mg/dL (ref 100–199)
HDL: 39 mg/dL — ABNORMAL LOW (ref >39)
LDL Chol Calc (NIH): 122 mg/dL — ABNORMAL HIGH (ref 0–99)
Triglycerides: 214 mg/dL — ABNORMAL HIGH (ref 0–149)
VLDL Cholesterol Cal: 38 mg/dL (ref 5–40)

## 2020-07-10 LAB — VITAMIN D 25 HYDROXY (VIT D DEFICIENCY, FRACTURES): Vit D, 25-Hydroxy: 30.6 ng/mL (ref 30.0–100.0)

## 2020-07-10 NOTE — Progress Notes (Signed)
Hello [place],  Your lab result is normal and/or stable.Some minor variations that are not significant are commonly marked abnormal, but do not represent any medical problem for you.  Best regards, Claretta Fraise, M.D.

## 2020-07-24 DIAGNOSIS — H524 Presbyopia: Secondary | ICD-10-CM | POA: Diagnosis not present

## 2020-07-24 DIAGNOSIS — H2513 Age-related nuclear cataract, bilateral: Secondary | ICD-10-CM | POA: Diagnosis not present

## 2021-01-05 ENCOUNTER — Encounter: Payer: Self-pay | Admitting: Family Medicine

## 2021-01-06 ENCOUNTER — Other Ambulatory Visit: Payer: Self-pay

## 2021-01-06 ENCOUNTER — Ambulatory Visit (INDEPENDENT_AMBULATORY_CARE_PROVIDER_SITE_OTHER): Payer: Medicare Other | Admitting: Family Medicine

## 2021-01-06 ENCOUNTER — Encounter: Payer: Self-pay | Admitting: Family Medicine

## 2021-01-06 VITALS — BP 95/44 | HR 66 | Temp 101.2°F | Ht 71.0 in | Wt 192.0 lb

## 2021-01-06 DIAGNOSIS — W57XXXA Bitten or stung by nonvenomous insect and other nonvenomous arthropods, initial encounter: Secondary | ICD-10-CM | POA: Diagnosis not present

## 2021-01-06 DIAGNOSIS — R509 Fever, unspecified: Secondary | ICD-10-CM

## 2021-01-06 DIAGNOSIS — M791 Myalgia, unspecified site: Secondary | ICD-10-CM | POA: Diagnosis not present

## 2021-01-06 NOTE — Progress Notes (Signed)
Acute Office Visit  Subjective:    Patient ID: Luke Herrera, male    DOB: 1945/10/31, 75 y.o.   MRN: [id number]  Chief Complaint  Patient presents with  . Tick Removal    HPI Patient is in today for body aches, headache, and fever that started yesterday. Reports neck, back, elbow, hand, and leg pain. Reports temperature of 102 yesterday. Denies cough, congestion, sore throat, chest pain, shortness of breath, nausea, vomiting, diarrhea, or stomach pain. He has had quite a few tick bites recently, probably 7-8 over the last 2-3 weeks. He did not noticed any rashes after the tick bites. He has taken aspirin with a little relief. He has had RMSF in 2016 x2. Reports joint and muscle pain has improved since yesterday.   Past Medical History:  Diagnosis Date  . CAD (coronary artery disease)    Occluded nondominant RCA with right to right collaterals - 05/2015, [facility], [place]   . Diverticulitis   . Diverticulosis   . History of [facility] spotted fever   . NSTEMI (non-ST elevated myocardial infarction) [facility]) October 2016    Past Surgical History:  Procedure Laterality Date  . BALLOON DILATION N/A 09/21/2014   Procedure: BALLOON DILATION;  Surgeon: Rogene Houston, MD;  [facility];  Service: Endoscopy;  Laterality: N/A;  . COLONOSCOPY N/A 09/21/2014   Procedure: COLONOSCOPY;  Surgeon: Rogene Houston, MD;  [facility];  Service: Endoscopy;  Laterality: N/A;  930  . ESOPHAGOGASTRODUODENOSCOPY N/A 09/21/2014   Procedure: ESOPHAGOGASTRODUODENOSCOPY (EGD);  Surgeon: Rogene Houston, MD;  [facility];  Service: Endoscopy;  Laterality: N/A;  . FLEXIBLE SIGMOIDOSCOPY  10/16/2011   Procedure: FLEXIBLE SIGMOIDOSCOPY;  Surgeon: Rogene Houston, MD;  [facility];  Service: Endoscopy;  Laterality: N/A;  930  . HEMORRHOID SURGERY    . TONSILLECTOMY      Family History  Problem Relation Age of Onset  . Healthy Son   .  Thyroid disease Daughter   . Cancer Maternal Grandfather   . Anesthesia problems Neg Hx   . Hypotension Neg Hx   . Malignant hyperthermia Neg Hx   . Pseudochol deficiency Neg Hx     Social History   Socioeconomic History  . Marital status: Married    Spouse name: Not on file  . Number of children: Not on file  . Years of education: Not on file  . Highest education level: Not on file  Occupational History  . Not on file  Tobacco Use  . Smoking status: Never Smoker  . Smokeless tobacco: Never Used  Vaping Use  . Vaping Use: Never used  Substance and Sexual Activity  . Alcohol use: No    Alcohol/week: 0.0 standard drinks  . Drug use: No  . Sexual activity: Yes    Birth control/protection: None  Other Topics Concern  . Not on file  Social History Narrative  . Not on file   Social Determinants of Health   Financial Resource Strain: Not on file  Food Insecurity: Not on file  Transportation Needs: Not on file  Physical Activity: Not on file  Stress: Not on file  Social Connections: Not on file  Intimate Partner Violence: Not on file    Outpatient Medications Prior to Visit  Medication Sig Dispense Refill  . Ascorbic Acid (VITAMIN C ER PO) Take by mouth.    Marland Kitchen aspirin 325 MG tablet Take 325 mg by mouth daily.    Marland Kitchen  Cholecalciferol (VITAMIN D) 50 MCG (2000 UT) tablet     . magnesium 30 MG tablet Take 30 mg by mouth 2 (two) times daily.    Marland Kitchen NIACIN CR PO Take by mouth.    . nitroGLYCERIN (NITROSTAT) 0.4 MG SL tablet Place 1 tablet (0.4 mg total) under the tongue every 5 (five) minutes as needed for chest pain. 25 tablet 3  . Potassium 99 MG TABS Take 1 tablet by mouth daily.    Marland Kitchen pyridOXINE (VITAMIN B-6) 100 MG tablet Take 100 mg by mouth daily.    . Turmeric 500 MG TABS Take by mouth.    . Wheat Dextrin (BENEFIBER DRINK MIX) PACK Take 4 g by mouth at bedtime.    . Zinc 100 MG TABS Take 50 mg by mouth daily.      No facility-administered medications prior to visit.     No Known Allergies  Review of Systems As per HPI.     Objective:    Physical Exam Vitals and nursing note reviewed.  Constitutional:      General: He is not in acute distress.    Appearance: He is not ill-appearing, toxic-appearing or diaphoretic.  Cardiovascular:     Rate and Rhythm: Normal rate and regular rhythm.     Heart sounds: Normal heart sounds. No murmur heard.   Pulmonary:     Effort: Pulmonary effort is normal. No respiratory distress.     Breath sounds: Normal breath sounds.  Musculoskeletal:     Cervical back: No rigidity.     Right lower leg: No edema.  Skin:    General: Skin is warm and dry.     Findings: No rash.  Neurological:     General: No focal deficit present.     Mental Status: He is alert and oriented to person, place, and time.     Gait: Gait normal.  Psychiatric:        Mood and Affect: Mood normal.        Behavior: Behavior normal.     BP (!) 95/44   Pulse 66   Temp (!) 101.2 F (38.4 C) (Oral)   Ht $R'5\' 11"'cw$  (1.803 m)   Wt 192 lb (87.1 kg)   SpO2 94%   BMI 26.78 kg/m  Wt Readings from Last 3 Encounters:  01/06/21 192 lb (87.1 kg)  07/09/20 188 lb (85.3 kg)  01/16/20 196 lb (88.9 kg)    Health Maintenance Due  Topic Date Due  . COVID-19 Vaccine (1) Never done    There are no preventive care reminders to display for this patient.   Lab Results  Component Value Date   TSH 1.779 05/17/2018   Lab Results  Component Value Date   WBC 7.6 07/09/2020   HGB 14.9 07/09/2020   HCT 43.9 07/09/2020   MCV 90 07/09/2020   PLT 269 07/09/2020   Lab Results  Component Value Date   NA 140 07/09/2020   K 4.0 07/09/2020   CO2 26 07/09/2020   GLUCOSE 108 (H) 07/09/2020   BUN 9 07/09/2020   CREATININE 1.03 07/09/2020   BILITOT 0.2 07/09/2020   ALKPHOS 80 07/09/2020   AST 26 07/09/2020   ALT 23 07/09/2020   PROT 6.8 07/09/2020   ALBUMIN 4.1 07/09/2020   CALCIUM 9.3 07/09/2020   ANIONGAP 8 05/17/2018   Lab Results   Component Value Date   CHOL 199 07/09/2020   Lab Results  Component Value Date   HDL 39 (L) 07/09/2020  Lab Results  Component Value Date   LDLCALC 122 (H) 07/09/2020   Lab Results  Component Value Date   TRIG 214 (H) 07/09/2020   Lab Results  Component Value Date   CHOLHDL 5.1 (H) 07/09/2020   No results found for: HGBA1C     Assessment & Plan:   Diagnoses and all orders for this visit:  Tick bite of multiple sites 7-8 tick bites over last month. History of RMSF. Reports fever and myalgias x 2 days. Labs pending as below.  -     CBC with Differential/Platelet -     CMP14+EGFR -     Rocky mtn spotted fvr abs pnl(IgG+IgM) -     Lyme Disease Serology w/Reflex  Fever, unspecified fever cause/Myalgia Labs pending as below. Discussed Covid, flu, or other viral illness as possible cause of symptoms. Patient declined Covid and flu testing today.  -     CBC with Differential/Platelet -     CMP14+EGFR -     Rocky mtn spotted fvr abs pnl(IgG+IgM) -     Lyme Disease Serology w/Reflex  Return to office for new or worsening symptoms, or if symptoms persist.   The patient indicates understanding of these issues and agrees with the plan.  Gwenlyn Perking, FNP

## 2021-01-08 LAB — CBC WITH DIFFERENTIAL/PLATELET
Basophils Absolute: 0 10*3/uL (ref 0.0–0.2)
Basos: 1 %
EOS (ABSOLUTE): 0 10*3/uL (ref 0.0–0.4)
Eos: 1 %
Hematocrit: 41.1 % (ref 37.5–51.0)
Hemoglobin: 14.1 g/dL (ref 13.0–17.7)
Immature Grans (Abs): 0 10*3/uL (ref 0.0–0.1)
Immature Granulocytes: 0 %
Lymphocytes Absolute: 0.4 10*3/uL — ABNORMAL LOW (ref 0.7–3.1)
Lymphs: 8 %
MCH: 31.1 pg (ref 26.6–33.0)
MCHC: 34.3 g/dL (ref 31.5–35.7)
MCV: 91 fL (ref 79–97)
Monocytes Absolute: 0.9 10*3/uL (ref 0.1–0.9)
Monocytes: 15 %
Neutrophils Absolute: 4.3 10*3/uL (ref 1.4–7.0)
Neutrophils: 75 %
Platelets: 223 10*3/uL (ref 150–450)
RBC: 4.53 x10E6/uL (ref 4.14–5.80)
RDW: 12.6 % (ref 11.6–15.4)
WBC: 5.7 10*3/uL (ref 3.4–10.8)

## 2021-01-08 LAB — CMP14+EGFR
ALT: 25 IU/L (ref 0–44)
AST: 25 IU/L (ref 0–40)
Albumin/Globulin Ratio: 1.8 (ref 1.2–2.2)
Albumin: 4.1 g/dL (ref 3.7–4.7)
Alkaline Phosphatase: 60 IU/L (ref 44–121)
BUN/Creatinine Ratio: 10 (ref 10–24)
BUN: 10 mg/dL (ref 8–27)
Bilirubin Total: 0.3 mg/dL (ref 0.0–1.2)
CO2: 21 mmol/L (ref 20–29)
Calcium: 8.7 mg/dL (ref 8.6–10.2)
Chloride: 102 mmol/L (ref 96–106)
Creatinine, Ser: 1.04 mg/dL (ref 0.76–1.27)
Globulin, Total: 2.3 g/dL (ref 1.5–4.5)
Glucose: 93 mg/dL (ref 65–99)
Potassium: 4 mmol/L (ref 3.5–5.2)
Sodium: 136 mmol/L (ref 134–144)
Total Protein: 6.4 g/dL (ref 6.0–8.5)
eGFR: 74 mL/min/{1.73_m2} (ref >59)

## 2021-01-08 LAB — LYME DISEASE SEROLOGY W/REFLEX: Lyme Total Antibody EIA: NEGATIVE

## 2021-01-08 LAB — ROCKY MTN SPOTTED FVR ABS PNL(IGG+IGM)
RMSF IgG: NEGATIVE
RMSF IgM: 0.44 index (ref 0.00–0.89)

## 2021-01-15 ENCOUNTER — Telehealth: Payer: Self-pay | Admitting: Family Medicine

## 2021-01-15 NOTE — Telephone Encounter (Signed)
Please have patient schedule an appointment. When I saw him on 5/23 he had a fever and body aches, no respiratory symptoms.

## 2021-01-15 NOTE — Telephone Encounter (Signed)
Pt states he has a productive cough and had a temp of 100 yesterday and fatigue. Pt is taking otc cough syrup and hasn't taken any tylenol or ibuprofen.

## 2021-01-15 NOTE — Telephone Encounter (Signed)
Pt called requesting that Tiffany call him in some medicine for his symptoms that Tiffany saw him for on 01/06/21.  Please advise and call patient. [phone]

## 2021-01-20 NOTE — Telephone Encounter (Signed)
LMOM for patient to call and schedule appt

## 2021-02-14 ENCOUNTER — Encounter: Payer: Self-pay | Admitting: Family Medicine

## 2021-02-14 ENCOUNTER — Ambulatory Visit: Payer: Medicare Other | Admitting: Family Medicine

## 2021-02-14 VITALS — BP 124/74 | HR 76 | Ht 71.0 in | Wt 186.4 lb

## 2021-02-14 DIAGNOSIS — R002 Palpitations: Secondary | ICD-10-CM | POA: Diagnosis not present

## 2021-02-14 DIAGNOSIS — I2510 Atherosclerotic heart disease of native coronary artery without angina pectoris: Secondary | ICD-10-CM

## 2021-02-14 DIAGNOSIS — E782 Mixed hyperlipidemia: Secondary | ICD-10-CM

## 2021-02-14 NOTE — Progress Notes (Signed)
Cardiology Office Note  Date: 02/14/2021   ID: Luke Herrera, DOB [birth year], MRN [id number]  PCP:  Claretta Fraise, MD  Cardiologist:  Rozann Lesches, MD Electrophysiologist:  None   Chief Complaint: 1 year follow-up  History of Present Illness: Luke Herrera is a 75 y.o. male with a history of CAD/NSTEMI, diverticulosis, RMSF.  He was last seen by Dr. Domenic Polite on 01/16/2020 for routine visit.  He did not report any progressive anginal symptoms or nitroglycerin use.  Occasionally felt brief sense of palpitations with prior documented PVCs.  No dizziness or syncope.  He was continuing aspirin and as needed nitroglycerin.  He had declined statin therapy.  He was using turmeric and niacin as well as a fiber supplement.  He is here for 1 year follow-up today.  He denies any recent acute illnesses or hospitalizations.  Denies any anginal or exertional symptoms, orthostatic symptoms, DOE or SOB, denies any PND or orthopnea.  No palpitations or arrhythmias.  No CVA or TIA-like symptoms.  Denies any bleeding.  No claudication-like symptoms.  No DVT or PE-like symptoms, or lower extremity edema.  Blood pressures well controlled at 124/74.  He is not on any antihypertensive medications.  He is on full dose aspirin and as needed nitroglycerin.  Past Medical History:  Diagnosis Date   CAD (coronary artery disease)    Occluded nondominant RCA with right to right collaterals - 05/2015, [facility], [place]    Diverticulitis    Diverticulosis    History of [facility] spotted fever    NSTEMI (non-ST elevated myocardial infarction) [facility]) October 2016    Past Surgical History:  Procedure Laterality Date   BALLOON DILATION N/A 09/21/2014   Procedure: BALLOON DILATION;  Surgeon: Rogene Houston, MD;  [facility];  Service: Endoscopy;  Laterality: N/A;   COLONOSCOPY N/A 09/21/2014   Procedure: COLONOSCOPY;  Surgeon: Rogene Houston, MD;  [facility];  Service: Endoscopy;  Laterality: N/A;  930   ESOPHAGOGASTRODUODENOSCOPY N/A 09/21/2014   Procedure: ESOPHAGOGASTRODUODENOSCOPY (EGD);  Surgeon: Rogene Houston, MD;  [facility];  Service: Endoscopy;  Laterality: N/A;   FLEXIBLE SIGMOIDOSCOPY  10/16/2011   Procedure: FLEXIBLE SIGMOIDOSCOPY;  Surgeon: Rogene Houston, MD;  [facility];  Service: Endoscopy;  Laterality: N/A;  930   HEMORRHOID SURGERY     TONSILLECTOMY      Current Outpatient Medications  Medication Sig Dispense Refill   Ascorbic Acid (VITAMIN C ER PO) Take by mouth.     aspirin 325 MG tablet Take 325 mg by mouth daily.     Cholecalciferol (VITAMIN D) 50 MCG (2000 UT) tablet      NIACIN CR PO Take by mouth.     nitroGLYCERIN (NITROSTAT) 0.4 MG SL tablet Place 1 tablet (0.4 mg total) under the tongue every 5 (five) minutes as needed for chest pain. 25 tablet 3   Potassium 99 MG TABS Take 1 tablet by mouth daily.     pyridOXINE (VITAMIN B-6) 100 MG tablet Take 100 mg by mouth daily.     Turmeric 500 MG TABS Take by mouth.     Wheat Dextrin (BENEFIBER DRINK MIX) PACK Take 4 g by mouth at bedtime.     Zinc 100 MG TABS Take 50 mg by mouth daily.      No current facility-administered medications for this visit.   Allergies:  Patient has no known allergies.   Social History: The patient  reports that he has never smoked. He has never used smokeless tobacco. He reports that he does not drink alcohol and does not use drugs.   Family History: The patient's family history includes Cancer in his maternal grandfather; Healthy in his son; Thyroid disease in his daughter.   ROS:  Please see the history of present illness. Otherwise, complete review of systems is positive for none.  All other systems are reviewed and negative.   Physical Exam: VS:  BP 124/74   Pulse 76   Ht 5\' 11"  (1.803 m)   Wt 186 lb 6.4 oz (84.6 kg)   SpO2 96%   BMI 26.00 kg/m , BMI Body mass index is 26 kg/m.  Wt Readings from  Last 3 Encounters:  02/14/21 186 lb 6.4 oz (84.6 kg)  01/06/21 192 lb (87.1 kg)  07/09/20 188 lb (85.3 kg)    General: Patient appears comfortable at rest. Neck: Supple, no elevated JVP or carotid bruits, no thyromegaly. Lungs: Clear to auscultation, nonlabored breathing at rest. Cardiac: Regular rate and rhythm, no S3 or significant systolic murmur, no pericardial rub. Extremities: No pitting edema, distal pulses 2+. Skin: Warm and dry. Musculoskeletal: No kyphosis. Neuropsychiatric: Alert and oriented x3, affect grossly appropriate.  ECG:    Recent Labwork: 01/06/2021: ALT 25; AST 25; BUN 10; Creatinine, Ser 1.04; Hemoglobin 14.1; Platelets 223; Potassium 4.0; Sodium 136     Component Value Date/Time   CHOL 199 07/09/2020 1652   TRIG 214 (H) 07/09/2020 1652   HDL 39 (L) 07/09/2020 1652   CHOLHDL 5.1 (H) 07/09/2020 1652   LDLCALC 122 (H) 07/09/2020 1652    Other Studies Reviewed Today: Holter monitor 05/11/2018: 48-hour Holter monitor reviewed.  Sinus rhythm was present throughout.  Heart rate ranged from 46 bpm up to 133 bpm with average heart rate 72 bpm.  There were rare to occasional PVCs noted with some ventricular bigeminy but no sustained events.  No pauses.  PVCs accounted for 1.3% of total beats.   Echocardiogram 05/11/2018: Study Conclusions   - Left ventricle: The cavity size was normal. Wall thickness was at   the upper limits of normal. Systolic function was normal. The   estimated ejection fraction was in the range of 55% to 60%. Wall   motion was normal; there were no regional wall motion   abnormalities. Left ventricular diastolic function parameters   were normal for the patient's age. - Mitral valve: There was trivial regurgitation. - Right atrium: Central venous pressure (est): 3 mm Hg. - Atrial septum: No defect or patent foramen ovale was identified. - Tricuspid valve: There was trivial regurgitation. - Pulmonary arteries: PA peak pressure: 19 mm Hg  (S). - Pericardium, extracardiac: There was no pericardial effusion.   Lexiscan Myoview 05/20/2018: There was no ST segment deviation noted during stress. The study is normal. There are no perfusion defects consistent with prior infarct or current ischemia. This is a low risk study. The left ventricular ejection fraction is normal (55-65%).    Assessment and Plan:  1. CAD in native artery   2. Mixed hyperlipidemia   3. Intermittent palpitations    1. CAD in native artery Hx of occluded RCA with right to right collaterals Continuing Aspirin and prn SL NTG.  No current anginal or exertional symptoms.  Continue to monitor.  2. Mixed hyperlipidemia Managed by diet, turmeric, Niacin CR, fiber supplement.  Last lipid profile on 07/09/2020: TC 199, HDL 89, TG 214, LDL 122.  Declines statin.  3.  Intermittent palpitations History of PVCs on previous monitor denies any current palpitations or arrhythmias.  Medication Adjustments/Labs and Tests Ordered: Current medicines are reviewed at length with the patient today.  Concerns regarding medicines are outlined above.   Disposition: Follow-up with Dr. Domenic Polite or APP 1 year  Signed, Levell July, NP 02/14/2021 4:10 PM    [facility] [place], [place], [place] [address] Phone: [phone]; Fax: [phone]

## 2021-02-14 NOTE — Patient Instructions (Signed)

## 2021-05-22 DIAGNOSIS — M542 Cervicalgia: Secondary | ICD-10-CM | POA: Insufficient documentation

## 2021-05-22 DIAGNOSIS — M25519 Pain in unspecified shoulder: Secondary | ICD-10-CM | POA: Insufficient documentation

## 2021-06-17 ENCOUNTER — Telehealth: Payer: Self-pay | Admitting: Family Medicine

## 2021-06-17 NOTE — Telephone Encounter (Signed)
Left message for patient to call back and schedule Medicare Annual Wellness Visit (AWV) to be completed by video or phone.   Last AWV: 04/27/2016  Please schedule at anytime with [facility] Health Advisor.  45 minute appointment  Any questions, please contact me at [phone]

## 2021-06-25 ENCOUNTER — Telehealth: Payer: Self-pay

## 2021-06-25 ENCOUNTER — Ambulatory Visit (INDEPENDENT_AMBULATORY_CARE_PROVIDER_SITE_OTHER): Payer: Medicare Other

## 2021-06-25 VITALS — Ht 71.0 in | Wt 183.0 lb

## 2021-06-25 DIAGNOSIS — Z0000 Encounter for general adult medical examination without abnormal findings: Secondary | ICD-10-CM

## 2021-06-25 NOTE — Patient Instructions (Signed)
Luke Herrera , Thank you for taking time to come for your Medicare Wellness Visit. I appreciate your ongoing commitment to your health goals. Please review the following plan we discussed and let me know if I can assist you in the future.   Screening recommendations/referrals: Colonoscopy: Done 09/12/2014 Repeat in 10 years  Recommended yearly ophthalmology/optometry visit for glaucoma screening and checkup Recommended yearly dental visit for hygiene and checkup  Vaccinations: Influenza vaccine: Declined. Pneumococcal vaccine: Declined. Tdap vaccine: Done 03/03/2019 Repeat in 10 years  Shingles vaccine: Declined.   Covid-19: Declined.  Advanced directives: Please bring a copy of your health care power of attorney and living will to the office to be added to your chart at your convenience.   Conditions/risks identified: Aim for 30 minutes of exercise or brisk walking each day, drink 6-8 glasses of water and eat lots of fruits and vegetables. KEEP UP THE GOOD WORK!!  Next appointment: Follow up in one year for your annual wellness visit. 2023.  Preventive Care 76 Years and Older, Male  Preventive care refers to lifestyle choices and visits with your health care provider that can promote health and wellness. What does preventive care include? A yearly physical exam. This is also called an annual well check. Dental exams once or twice a year. Routine eye exams. Ask your health care provider how often you should have your eyes checked. Personal lifestyle choices, including: Daily care of your teeth and gums. Regular physical activity. Eating a healthy diet. Avoiding tobacco and drug use. Limiting alcohol use. Practicing safe sex. Taking low doses of aspirin every day. Taking vitamin and mineral supplements as recommended by your health care provider. What happens during an annual well check? The services and screenings done by your health care provider during your annual well check  will depend on your age, overall health, lifestyle risk factors, and family history of disease. Counseling  Your health care provider may ask you questions about your: Alcohol use. Tobacco use. Drug use. Emotional well-being. Home and relationship well-being. Sexual activity. Eating habits. History of falls. Memory and ability to understand (cognition). Work and work Statistician. Screening  You may have the following tests or measurements: Height, weight, and BMI. Blood pressure. Lipid and cholesterol levels. These may be checked every 5 years, or more frequently if you are over 50 years old. Skin check. Lung cancer screening. You may have this screening every year starting at age 76 if you have a 30-pack-year history of smoking and currently smoke or have quit within the past 15 years. Fecal occult blood test (FOBT) of the stool. You may have this test every year starting at age 76. Flexible sigmoidoscopy or colonoscopy. You may have a sigmoidoscopy every 5 years or a colonoscopy every 10 years starting at age 76. Prostate cancer screening. Recommendations will vary depending on your family history and other risks. Hepatitis C blood test. Hepatitis B blood test. Sexually transmitted disease (STD) testing. Diabetes screening. This is done by checking your blood sugar (glucose) after you have not eaten for a while (fasting). You may have this done every 1-3 years. Abdominal aortic aneurysm (AAA) screening. You may need this if you are a current or former smoker. Osteoporosis. You may be screened starting at age 76 if you are at high risk. Talk with your health care provider about your test results, treatment options, and if necessary, the need for more tests. Vaccines  Your health care provider may recommend certain vaccines, such as: Influenza vaccine.  This is recommended every year. Tetanus, diphtheria, and acellular pertussis (Tdap, Td) vaccine. You may need a Td booster every 10  years. Zoster vaccine. You may need this after age 76. Pneumococcal 13-valent conjugate (PCV13) vaccine. One dose is recommended after age 76. Pneumococcal polysaccharide (PPSV23) vaccine. One dose is recommended after age 76. Talk to your health care provider about which screenings and vaccines you need and how often you need them. This information is not intended to replace advice given to you by your health care provider. Make sure you discuss any questions you have with your health care provider. Document Released: 08/30/2015 Document Revised: 04/22/2016 Document Reviewed: 06/04/2015 Elsevier Interactive Patient Education  2017 [employer] Prevention in the Home Falls can cause injuries. They can happen to people of all ages. There are many things you can do to make your home safe and to help prevent falls. What can I do on the outside of my home? Regularly fix the edges of walkways and driveways and fix any cracks. Remove anything that might make you trip as you walk through a door, such as a raised step or threshold. Trim any bushes or trees on the path to your home. Use bright outdoor lighting. Clear any walking paths of anything that might make someone trip, such as rocks or tools. Regularly check to see if handrails are loose or broken. Make sure that both sides of any steps have handrails. Any raised decks and porches should have guardrails on the edges. Have any leaves, snow, or ice cleared regularly. Use sand or salt on walking paths during winter. Clean up any spills in your garage right away. This includes oil or grease spills. What can I do in the bathroom? Use night lights. Install grab bars by the toilet and in the tub and shower. Do not use towel bars as grab bars. Use non-skid mats or decals in the tub or shower. If you need to sit down in the shower, use a plastic, non-slip stool. Keep the floor dry. Clean up any water that spills on the floor as soon as it  happens. Remove soap buildup in the tub or shower regularly. Attach bath mats securely with double-sided non-slip rug tape. Do not have throw rugs and other things on the floor that can make you trip. What can I do in the bedroom? Use night lights. Make sure that you have a light by your bed that is easy to reach. Do not use any sheets or blankets that are too big for your bed. They should not hang down onto the floor. Have a firm chair that has side arms. You can use this for support while you get dressed. Do not have throw rugs and other things on the floor that can make you trip. What can I do in the kitchen? Clean up any spills right away. Avoid walking on wet floors. Keep items that you use a lot in easy-to-reach places. If you need to reach something above you, use a strong step stool that has a grab bar. Keep electrical cords out of the way. Do not use floor polish or wax that makes floors slippery. If you must use wax, use non-skid floor wax. Do not have throw rugs and other things on the floor that can make you trip. What can I do with my stairs? Do not leave any items on the stairs. Make sure that there are handrails on both sides of the stairs and use them. Fix handrails  that are broken or loose. Make sure that handrails are as long as the stairways. Check any carpeting to make sure that it is firmly attached to the stairs. Fix any carpet that is loose or worn. Avoid having throw rugs at the top or bottom of the stairs. If you do have throw rugs, attach them to the floor with carpet tape. Make sure that you have a light switch at the top of the stairs and the bottom of the stairs. If you do not have them, ask someone to add them for you. What else can I do to help prevent falls? Wear shoes that: Do not have high heels. Have rubber bottoms. Are comfortable and fit you well. Are closed at the toe. Do not wear sandals. If you use a stepladder: Make sure that it is fully opened.  Do not climb a closed stepladder. Make sure that both sides of the stepladder are locked into place. Ask someone to hold it for you, if possible. Clearly mark and make sure that you can see: Any grab bars or handrails. First and last steps. Where the edge of each step is. Use tools that help you move around (mobility aids) if they are needed. These include: Canes. Walkers. Scooters. Crutches. Turn on the lights when you go into a dark area. Replace any light bulbs as soon as they burn out. Set up your furniture so you have a clear path. Avoid moving your furniture around. If any of your floors are uneven, fix them. If there are any pets around you, be aware of where they are. Review your medicines with your doctor. Some medicines can make you feel dizzy. This can increase your chance of falling. Ask your doctor what other things that you can do to help prevent falls. This information is not intended to replace advice given to you by your health care provider. Make sure you discuss any questions you have with your health care provider. Document Released: 05/30/2009 Document Revised: 01/09/2016 Document Reviewed: 09/07/2014 Elsevier Interactive Patient Education  2017 [employer].

## 2021-06-25 NOTE — Telephone Encounter (Signed)
Called pt for AWV. Pt asks when he should come back in for follow up? Last appointment in office was 06/2020. Thank you.

## 2021-06-25 NOTE — Telephone Encounter (Signed)
He needs follow up annually. That could be anytime after 07/09/21

## 2021-06-25 NOTE — Progress Notes (Signed)
Subjective:   Luke Herrera is a 75 y.o. male who presents for Medicare Annual/Subsequent preventive examination. Virtual Visit via Telephone Note  I connected with  Luke Herrera on 06/25/21 at  2:45 PM EST by telephone and verified that I am speaking with the correct person using two identifiers.  Location: Patient: Home Provider: [facility] Persons participating in the virtual visit: patient/Nurse Health Advisor   I discussed the limitations, risks, security and privacy concerns of performing an evaluation and management service by telephone and the availability of in person appointments. The patient expressed understanding and agreed to proceed.  Interactive audio and video telecommunications were attempted between this nurse and patient, however failed, due to patient having technical difficulties OR patient did not have access to video capability.  We continued and completed visit with audio only.  Some vital signs may be absent or patient reported.   Chriss Driver, LPN  Review of Systems     Cardiac Risk Factors include: male gender;advanced age (>61men, >81 women);dyslipidemia;sedentary lifestyle     Objective:    Today's Vitals   06/25/21 1433  Weight: 183 lb (83 kg)  Height: 5\' 11"  (1.803 m)   Body mass index is 25.52 kg/m.  Advanced Directives 06/25/2021 03/03/2019 05/09/2018 05/02/2017 04/27/2016 09/21/2014 10/16/2011  Does Patient Have a Medical Advance Directive? Yes No Yes Yes Yes No Patient does not have advance directive;Patient would not like information  Type of Advance Directive Living will;Healthcare Power of [place];Living will [place];Living will Living will;Healthcare Power of Attorney - -  Copy of Effort in Chart? - - - No - copy requested No - copy requested - -  Would patient like information on creating a medical advance directive? - No - Patient declined - - - No - patient  declined information -  Pre-existing out of facility DNR order (yellow form or pink MOST form) - - - - - - No    Current Medications (verified) Outpatient Encounter Medications as of 06/25/2021  Medication Sig   Ascorbic Acid (VITAMIN C ER PO) Take by mouth.   aspirin 325 MG tablet Take 325 mg by mouth daily.   Cholecalciferol (VITAMIN D) 50 MCG (2000 UT) tablet    NIACIN CR PO Take by mouth.   nitroGLYCERIN (NITROSTAT) 0.4 MG SL tablet Place 1 tablet (0.4 mg total) under the tongue every 5 (five) minutes as needed for chest pain.   Potassium 99 MG TABS Take 1 tablet by mouth daily.   pyridOXINE (VITAMIN B-6) 100 MG tablet Take 100 mg by mouth daily.   Turmeric 500 MG TABS Take by mouth.   Wheat Dextrin (BENEFIBER DRINK MIX) PACK Take 4 g by mouth at bedtime.   Zinc 100 MG TABS Take 50 mg by mouth daily.    No facility-administered encounter medications on file as of 06/25/2021.    Allergies (verified) Patient has no known allergies.   History: Past Medical History:  Diagnosis Date   CAD (coronary artery disease)    Occluded nondominant RCA with right to right collaterals - 05/2015, [facility], [place]    Diverticulitis    Diverticulosis    History of [facility] spotted fever    NSTEMI (non-ST elevated myocardial infarction) [facility]) October 2016   Past Surgical History:  Procedure Laterality Date   BALLOON DILATION N/A 09/21/2014   Procedure: BALLOON DILATION;  Surgeon: Rogene Houston, MD;  Location: [facility]  END[facility];  Service: Endoscopy;  Laterality: N/A;   COLONOSCOPY N/A 09/21/2014   Procedure: COLONOSCOPY;  Surgeon: Rogene Houston, MD;  Location: [facility];  Service: Endoscopy;  Laterality: N/A;  930   ESOPHAGOGASTRODUODENOSCOPY N/A 09/21/2014   Procedure: ESOPHAGOGASTRODUODENOSCOPY (EGD);  Surgeon: Rogene Houston, MD;  Location[facility];  Service: Endoscopy;  Laterality: N/A;   FLEXIBLE SIGMOIDOSCOPY  10/16/2011   Procedure: FLEXIBLE  SIGMOIDOSCOPY;  Surgeon: Rogene Houston, MD;  Location: [facility];  Service: Endoscopy;  Laterality: N/A;  31   HEMORRHOID SURGERY     TONSILLECTOMY     Family History  Problem Relation Age of Onset   Healthy Son    Thyroid disease Daughter    Cancer Maternal Grandfather    Anesthesia problems Neg Hx    Hypotension Neg Hx    Malignant hyperthermia Neg Hx    Pseudochol deficiency Neg Hx    Social History   Socioeconomic History   Marital status: Married    Spouse name: Not on file   Number of children: Not on file   Years of education: Not on file   Highest education level: Not on file  Occupational History   Not on file  Tobacco Use   Smoking status: Never   Smokeless tobacco: Never  Vaping Use   Vaping Use: Never used  Substance and Sexual Activity   Alcohol use: No    Alcohol/week: 0.0 standard drinks   Drug use: No   Sexual activity: Yes    Birth control/protection: None  Other Topics Concern   Not on file  Social History Narrative   Not on file   Social Determinants of Health   Financial Resource Strain: Not on file  Food Insecurity: No Food Insecurity   Worried About Running Out of Food in the Last Year: Never true   Ran Out of Food in the Last Year: Never true  Transportation Needs: No Transportation Needs   Lack of Transportation (Medical): No   Lack of Transportation (Non-Medical): No  Physical Activity: Sufficiently Active   Days of Exercise per Week: 7 days   Minutes of Exercise per Session: 40 min  Stress: No Stress Concern Present   Feeling of Stress : Not at all  Social Connections: Socially Integrated   Frequency of Communication with Friends and Family: More than three times a week   Frequency of Social Gatherings with Friends and Family: More than three times a week   Attends Religious Services: More than 4 times per year   Active Member of [employer] or Organizations: Yes   Attends Music therapist: More than 4 times per year    Marital Status: Married    Tobacco Counseling Counseling given: Not Answered   Clinical Intake:  Pre-visit preparation completed: Yes  Pain : No/denies pain     BMI - recorded: 25.52 Nutritional Status: BMI 25 -29 Overweight Nutritional Risks: None Diabetes: No  How often do you need to have someone help you when you read instructions, pamphlets, or other written materials from your doctor or pharmacy?: 1 - Never  Diabetic?no  Interpreter Needed?: No  Information entered by :: MJ Tedric Leeth, LPN   Activities of Daily Living In your present state of health, do you have any difficulty performing the following activities: 06/25/2021 06/21/2021  Hearing? N N  Vision? N N  Difficulty concentrating or making decisions? Y N  Walking or climbing stairs? N N  Dressing or bathing? N N  Doing errands, shopping? N N  Preparing Food and eating ? N N  Using the Toilet? N N  In the past six months, have you accidently leaked urine? N N  Do you have problems with loss of bowel control? N N  Managing your Medications? N N  Managing your Finances? N N  Housekeeping or managing your Housekeeping? N N  Some recent data might be hidden    Patient Care Team: Claretta Fraise, MD as PCP - General (Family Medicine) Satira Sark, MD as PCP - Cardiology (Cardiology) Roseanne Kaufman, MD as Consulting Physician (Orthopedic Surgery)  Indicate any recent Medical Services you may have received from other than [facility] providers in the past year (date may be approximate).     Assessment:   This is a routine wellness examination for Luke Herrera.  Hearing/Vision screen Hearing Screening - Comments:: No hearing issues.  Vision Screening - Comments:: Glasses. [place]. 2022.  Dietary issues and exercise activities discussed: Current Exercise Habits: Home exercise routine, Type of exercise: walking, Time (Minutes): 40, Frequency (Times/Week): 5, Weekly Exercise (Minutes/Week): 200,  Intensity: Mild, Exercise limited by: cardiac condition(s)   Goals Addressed   None    Depression Screen PHQ 2/9 Scores 06/25/2021 07/09/2020 09/28/2017 09/08/2017 04/27/2016 01/28/2016 03/22/2015  PHQ - 2 Score 0 0 0 0 0 0 0    Fall Risk Fall Risk  06/25/2021 06/21/2021 07/09/2020 09/28/2017 09/08/2017  Falls in the past year? 0 0 1 No No  Comment - - - - -  Number falls in past yr: 0 - 0 - -  Comment - - - - -  Injury with Fall? 0 0 0 - -  Risk for fall due to : No Fall Risks - History of fall(s) - -  Follow up Falls prevention discussed - Falls evaluation completed - -    FALL RISK PREVENTION PERTAINING TO THE HOME:  Any stairs in or around the home? Yes  If so, are there any without handrails? No  Home free of loose throw rugs in walkways, pet beds, electrical cords, etc? Yes  Adequate lighting in your home to reduce risk of falls? Yes   ASSISTIVE DEVICES UTILIZED TO PREVENT FALLS:  Life alert? No  Use of a cane, walker or w/c? No  Grab bars in the bathroom? No  Shower chair or bench in shower? Yes  Elevated toilet seat or a handicapped toilet? Yes   TIMED UP AND GO:  Was the test performed? No . Phone visit.   Cognitive Function: MMSE - Mini Mental State Exam 04/27/2016  Orientation to time 5  Orientation to Place 5  Registration 3  Attention/ Calculation 5  Recall 3  Language- name 2 objects 2  Language- repeat 1  Language- follow 3 step command 3  Language- read & follow direction 1  Write a sentence 1  Copy design 1  Total score 30     6CIT Screen 06/25/2021  What Year? 0 points  What month? 0 points  What time? 0 points  Count back from 20 0 points  Months in reverse 0 points  Repeat phrase 2 points  Total Score 2    Immunizations Immunization History  Administered Date(s) Administered   Tdap 03/03/2019    TDAP status: Due, Education has been provided regarding the importance of this vaccine. Advised may receive this vaccine at local pharmacy or  Health Dept. Aware to provide a copy of the vaccination record if obtained from local pharmacy or Health  Dept. Verbalized acceptance and understanding.  Flu Vaccine status: Declined, Education has been provided regarding the importance of this vaccine but patient still declined. Advised may receive this vaccine at local pharmacy or Health Dept. Aware to provide a copy of the vaccination record if obtained from local pharmacy or Health Dept. Verbalized acceptance and understanding.  Pneumococcal vaccine status: Declined,  Education has been provided regarding the importance of this vaccine but patient still declined. Advised may receive this vaccine at local pharmacy or Health Dept. Aware to provide a copy of the vaccination record if obtained from local pharmacy or Health Dept. Verbalized acceptance and understanding.   Covid-19 vaccine status: Declined, Education has been provided regarding the importance of this vaccine but patient still declined. Advised may receive this vaccine at local pharmacy or Health Dept.or vaccine clinic. Aware to provide a copy of the vaccination record if obtained from local pharmacy or Health Dept. Verbalized acceptance and understanding.  Qualifies for Shingles Vaccine? Yes   Zostavax completed No   Shingrix Completed?: No.    Education has been provided regarding the importance of this vaccine. Patient has been advised to call insurance company to determine out of pocket expense if they have not yet received this vaccine. Advised may also receive vaccine at local pharmacy or Health Dept. Verbalized acceptance and understanding.  Screening Tests Health Maintenance  Topic Date Due   COVID-19 Vaccine (1) Never done   Pneumonia Vaccine 13+ Years old (1 - PCV) Never done   Zoster Vaccines- Shingrix (1 of 2) Never done   INFLUENZA VACCINE  Never done   TETANUS/TDAP  03/02/2029   Hepatitis C Screening  Completed   HPV VACCINES  Aged Out    Health  Maintenance  Health Maintenance Due  Topic Date Due   COVID-19 Vaccine (1) Never done   Pneumonia Vaccine 2+ Years old (1 - PCV) Never done   Zoster Vaccines- Shingrix (1 of 2) Never done   INFLUENZA VACCINE  Never done    Colorectal cancer screening: Type of screening: Colonoscopy. Completed 09/12/2014. Repeat every 10 years  Lung Cancer Screening: (Low Dose CT Chest recommended if Age 33-80 years, 30 pack-year currently smoking OR have quit w/in 15years.) does not qualify.    Additional Screening:  Hepatitis C Screening: does qualify; Completed 11/28/2015  Vision Screening: Recommended annual ophthalmology exams for early detection of glaucoma and other disorders of the eye. Is the patient up to date with their annual eye exam?  Yes  Who is the provider or what is the name of the office in which the patient attends annual eye exams? [place] If pt is not established with a provider, would they like to be referred to a provider to establish care? No .   Dental Screening: Recommended annual dental exams for proper oral hygiene  Community Resource Referral / Chronic Care Management: CRR required this visit?  No   CCM required this visit?  No      Plan:     I have personally reviewed and noted the following in the patient's chart:   Medical and social history Use of alcohol, tobacco or illicit drugs  Current medications and supplements including opioid prescriptions. Patient is not currently taking opioid prescriptions. Functional ability and status Nutritional status Physical activity Advanced directives List of other physicians Hospitalizations, surgeries, and ER visits in previous 12 months Vitals Screenings to include cognitive, depression, and falls Referrals and appointments  In addition, I have reviewed and discussed with patient  certain preventive protocols, quality metrics, and best practice recommendations. A written personalized care plan for preventive  services as well as general preventive health recommendations were provided to patient.     Chriss Driver, LPN   [id number]   Nurse Notes: Up to date on health maintenance. Pt declines all vaccines.

## 2021-06-26 NOTE — Telephone Encounter (Signed)
Left message to call back  

## 2021-07-09 NOTE — Telephone Encounter (Signed)
Patient has appt 11/29

## 2021-07-15 ENCOUNTER — Ambulatory Visit (INDEPENDENT_AMBULATORY_CARE_PROVIDER_SITE_OTHER): Payer: Medicare Other | Admitting: Family Medicine

## 2021-07-15 ENCOUNTER — Other Ambulatory Visit: Payer: Self-pay

## 2021-07-15 ENCOUNTER — Encounter: Payer: Self-pay | Admitting: Family Medicine

## 2021-07-15 VITALS — BP 135/75 | HR 62 | Temp 97.7°F | Wt 189.8 lb

## 2021-07-15 DIAGNOSIS — Z0000 Encounter for general adult medical examination without abnormal findings: Secondary | ICD-10-CM

## 2021-07-15 DIAGNOSIS — Z789 Other specified health status: Secondary | ICD-10-CM | POA: Insufficient documentation

## 2021-07-15 DIAGNOSIS — Z125 Encounter for screening for malignant neoplasm of prostate: Secondary | ICD-10-CM | POA: Diagnosis not present

## 2021-07-15 DIAGNOSIS — I2510 Atherosclerotic heart disease of native coronary artery without angina pectoris: Secondary | ICD-10-CM | POA: Diagnosis not present

## 2021-07-15 DIAGNOSIS — Z0001 Encounter for general adult medical examination with abnormal findings: Secondary | ICD-10-CM | POA: Diagnosis not present

## 2021-07-15 NOTE — Progress Notes (Signed)
Subjective:  Patient ID: Luke Herrera, male    DOB: [birth year]  Age: 75 y.o. MRN: [id number]  CC: Medical Management of Chronic Issues   HPI Luke Herrera presents for coronary artery disease. Had MI in 2016. No stents. One blockage that was small. Can't take statins. They cause extensive myalgia.   Depression screen [facility] 2/9 07/15/2021 06/25/2021 07/09/2020  Decreased Interest 0 0 0  Down, Depressed, Hopeless 0 0 0  PHQ - 2 Score 0 0 0    History Luke Herrera has a past medical history of CAD (coronary artery disease), Diverticulitis, Diverticulosis, History of [facility] spotted fever, and NSTEMI (non-ST elevated myocardial infarction) Luke Herrera) (October 2016).   Luke Herrera has a past surgical history that includes Tonsillectomy; Hemorrhoid surgery; Flexible sigmoidoscopy (10/16/2011); Colonoscopy (N/A, 09/21/2014); Esophagogastroduodenoscopy (N/A, 09/21/2014); and Balloon dilation (N/A, 09/21/2014).   His family history includes Cancer in his maternal grandfather; Healthy in his son; Thyroid disease in his daughter.Luke Herrera reports that Luke Herrera has never smoked. Luke Herrera has never used smokeless tobacco. Luke Herrera reports that Luke Herrera does not drink alcohol and does not use drugs.    ROS Review of Systems  Constitutional: Negative.  Negative for activity change and fatigue.  HENT: Negative.    Eyes:  Negative for visual disturbance.  Respiratory:  Negative for cough and shortness of breath.   Cardiovascular:  Negative for chest pain and leg swelling.  Gastrointestinal:  Negative for abdominal pain, diarrhea, nausea and vomiting.  Genitourinary:  Positive for frequency (nocturia X 1-2). Negative for difficulty urinating.  Musculoskeletal:  Negative for arthralgias and myalgias.  Skin:  Negative for rash.  Neurological:  Negative for headaches.  Psychiatric/Behavioral:  Negative for sleep disturbance.    Objective:  BP 135/75   Pulse 62   Temp 97.7 F (36.5 C)   Wt 189 lb 12.8 oz (86.1 kg)   SpO2 96%   BMI  26.47 kg/m   BP Readings from Last 3 Encounters:  07/15/21 135/75  02/14/21 124/74  01/06/21 (!) 95/44    Wt Readings from Last 3 Encounters:  07/15/21 189 lb 12.8 oz (86.1 kg)  06/25/21 183 lb (83 kg)  02/14/21 186 lb 6.4 oz (84.6 kg)     Physical Exam Constitutional:      General: Luke Herrera is not in acute distress.    Appearance: Luke Herrera is well-developed.  HENT:     Head: Normocephalic and atraumatic.     Right Ear: External ear normal.     Left Ear: External ear normal.     Nose: Nose normal.  Eyes:     Conjunctiva/sclera: Conjunctivae normal.     Pupils: Pupils are equal, round, and reactive to light.  Cardiovascular:     Rate and Rhythm: Normal rate and regular rhythm.     Heart sounds: Normal heart sounds. No murmur heard. Pulmonary:     Effort: Pulmonary effort is normal. No respiratory distress.     Breath sounds: Normal breath sounds. No wheezing or rales.  Abdominal:     Palpations: Abdomen is soft.     Tenderness: There is no abdominal tenderness.  Musculoskeletal:        General: Normal range of motion.     Cervical back: Normal range of motion and neck supple.  Skin:    General: Skin is warm and dry.  Neurological:     Mental Status: Luke Herrera is alert and oriented to person, place, and time.     Deep Tendon Reflexes: Reflexes are normal and symmetric.  Psychiatric:        Behavior: Behavior normal.        Thought Content: Thought content normal.        Judgment: Judgment normal.      Assessment & Plan:   Luke Herrera was seen today for medical management of chronic issues.  Diagnoses and all orders for this visit:  Well adult exam  Screening for prostate cancer -     PSA, total and free  Coronary artery disease involving native coronary artery of native heart without angina pectoris -     CBC with Differential/Platelet -     CMP14+EGFR -     Lipid panel  Statin intolerance      I am having Luke Herrera maintain his Zinc, Benefiber Drink Mix,  aspirin, NIACIN CR PO, Potassium, Turmeric, Ascorbic Acid (VITAMIN C ER PO), nitroGLYCERIN, pyridOXINE, and Vitamin D.  Allergies as of 07/15/2021   No Known Allergies      Medication List        Accurate as of July 15, 2021  9:25 AM. If you have any questions, ask your nurse or doctor.          aspirin 325 MG tablet Take 325 mg by mouth daily.   Benefiber Drink Mix Pack Take 4 g by mouth at bedtime.   NIACIN CR PO Take by mouth.   nitroGLYCERIN 0.4 MG SL tablet Commonly known as: NITROSTAT Place 1 tablet (0.4 mg total) under the tongue every 5 (five) minutes as needed for chest pain.   Potassium 99 MG Tabs Take 1 tablet by mouth daily.   pyridOXINE 100 MG tablet Commonly known as: VITAMIN B-6 Take 100 mg by mouth daily.   Turmeric 500 MG Tabs Take by mouth.   VITAMIN C ER PO Take by mouth.   Vitamin D 50 MCG (2000 UT) tablet   Zinc 100 MG Tabs Take 50 mg by mouth daily.         Follow-up: Return in about 1 year (around 07/15/2022).  Claretta Fraise, M.D.

## 2021-07-16 LAB — CBC WITH DIFFERENTIAL/PLATELET
Basophils Absolute: 0 10*3/uL (ref 0.0–0.2)
Basos: 1 %
EOS (ABSOLUTE): 0.1 10*3/uL (ref 0.0–0.4)
Eos: 1 %
Hematocrit: 45.3 % (ref 37.5–51.0)
Hemoglobin: 15.2 g/dL (ref 13.0–17.7)
Immature Grans (Abs): 0 10*3/uL (ref 0.0–0.1)
Immature Granulocytes: 1 %
Lymphocytes Absolute: 2.1 10*3/uL (ref 0.7–3.1)
Lymphs: 33 %
MCH: 28.9 pg (ref 26.6–33.0)
MCHC: 33.6 g/dL (ref 31.5–35.7)
MCV: 86 fL (ref 79–97)
Monocytes Absolute: 0.6 10*3/uL (ref 0.1–0.9)
Monocytes: 9 %
Neutrophils Absolute: 3.6 10*3/uL (ref 1.4–7.0)
Neutrophils: 55 %
Platelets: 247 10*3/uL (ref 150–450)
RBC: 5.26 x10E6/uL (ref 4.14–5.80)
RDW: 13.1 % (ref 11.6–15.4)
WBC: 6.4 10*3/uL (ref 3.4–10.8)

## 2021-07-16 LAB — CMP14+EGFR
ALT: 23 IU/L (ref 0–44)
AST: 26 IU/L (ref 0–40)
Albumin/Globulin Ratio: 1.6 (ref 1.2–2.2)
Albumin: 4.2 g/dL (ref 3.7–4.7)
Alkaline Phosphatase: 80 IU/L (ref 44–121)
BUN/Creatinine Ratio: 12 (ref 10–24)
BUN: 13 mg/dL (ref 8–27)
Bilirubin Total: 0.3 mg/dL (ref 0.0–1.2)
CO2: 25 mmol/L (ref 20–29)
Calcium: 9.4 mg/dL (ref 8.6–10.2)
Chloride: 104 mmol/L (ref 96–106)
Creatinine, Ser: 1.07 mg/dL (ref 0.76–1.27)
Globulin, Total: 2.6 g/dL (ref 1.5–4.5)
Glucose: 99 mg/dL (ref 70–99)
Potassium: 4.2 mmol/L (ref 3.5–5.2)
Sodium: 142 mmol/L (ref 134–144)
Total Protein: 6.8 g/dL (ref 6.0–8.5)
eGFR: 72 mL/min/{1.73_m2} (ref >59)

## 2021-07-16 LAB — LIPID PANEL
Chol/HDL Ratio: 6.1 ratio — ABNORMAL HIGH (ref 0.0–5.0)
Cholesterol, Total: 208 mg/dL — ABNORMAL HIGH (ref 100–199)
HDL: 34 mg/dL — ABNORMAL LOW (ref >39)
LDL Chol Calc (NIH): 152 mg/dL — ABNORMAL HIGH (ref 0–99)
Triglycerides: 123 mg/dL (ref 0–149)
VLDL Cholesterol Cal: 22 mg/dL (ref 5–40)

## 2021-07-16 LAB — PSA, TOTAL AND FREE
PSA, Free Pct: 27.6 %
PSA, Free: 0.94 ng/mL
Prostate Specific Ag, Serum: 3.4 ng/mL (ref 0.0–4.0)

## 2021-08-22 DIAGNOSIS — M25512 Pain in left shoulder: Secondary | ICD-10-CM | POA: Diagnosis not present

## 2021-09-03 DIAGNOSIS — M25512 Pain in left shoulder: Secondary | ICD-10-CM | POA: Diagnosis not present

## 2021-09-11 DIAGNOSIS — M75122 Complete rotator cuff tear or rupture of left shoulder, not specified as traumatic: Secondary | ICD-10-CM | POA: Diagnosis not present

## 2022-01-06 DIAGNOSIS — M25521 Pain in right elbow: Secondary | ICD-10-CM | POA: Diagnosis not present

## 2022-05-26 NOTE — Progress Notes (Unsigned)
Cardiology Office Note  Date: 05/27/2022   ID: Luke Herrera, DOB [birth year], MRN [id number]  PCP:  Claretta Fraise, MD  Cardiologist:  Rozann Lesches, MD Electrophysiologist:  None   Chief Complaint  Patient presents with   Cardiac follow-up    History of Present Illness: Luke Herrera is a 76 y.o. male last seen in July 2022 by Mr. Leonides Sake NP.  He is here for a routine visit.  Still enjoys working outdoors, states that he tries to get in 3 miles of walking in a day.  Does feel like he has slowed down over time, but no abrupt change in stamina, no progressive sense of palpitations, no increasing angina or nitroglycerin use.  I reviewed his medications which are outlined below.  He has consistently declined statin therapy.  Most recent LDL was 152.  I personally reviewed his ECG today which shows sinus rhythm with prolonged PR interval and nonspecific ST-T changes.  We discussed getting a refill for fresh bottle of nitroglycerin.  Past Medical History:  Diagnosis Date   CAD (coronary artery disease)    Occluded nondominant RCA with right to right collaterals - 05/2015, [facility], [place]    Diverticulitis    Diverticulosis    History of [facility] spotted fever    NSTEMI (non-ST elevated myocardial infarction) [facility]) October 2016    Past Surgical History:  Procedure Laterality Date   BALLOON DILATION N/A 09/21/2014   Procedure: BALLOON DILATION;  Surgeon: Rogene Houston, MD;  [facility];  Service: Endoscopy;  Laterality: N/A;   COLONOSCOPY N/A 09/21/2014   Procedure: COLONOSCOPY;  Surgeon: Rogene Houston, MD;  Location: [facility];  Service: Endoscopy;  Laterality: N/A;  930   ESOPHAGOGASTRODUODENOSCOPY N/A 09/21/2014   Procedure: ESOPHAGOGASTRODUODENOSCOPY (EGD);  Surgeon: Rogene Houston, MD;  Location: [facility];  Service: Endoscopy;  Laterality: N/A;   FLEXIBLE SIGMOIDOSCOPY  10/16/2011   Procedure: FLEXIBLE  SIGMOIDOSCOPY;  Surgeon: Rogene Houston, MD;  Location: [facility];  Service: Endoscopy;  Laterality: N/A;  930   HEMORRHOID SURGERY     TONSILLECTOMY      Current Outpatient Medications  Medication Sig Dispense Refill   Ascorbic Acid (VITAMIN C ER PO) Take by mouth.     aspirin 325 MG tablet Take 325 mg by mouth daily.     Cholecalciferol (VITAMIN D) 50 MCG (2000 UT) tablet      NIACIN CR PO Take by mouth.     Potassium 99 MG TABS Take 1 tablet by mouth daily.     pyridOXINE (VITAMIN B-6) 100 MG tablet Take 100 mg by mouth daily.     Turmeric 500 MG TABS Take by mouth.     Wheat Dextrin (BENEFIBER DRINK MIX) PACK Take 4 g by mouth at bedtime.     Zinc 100 MG TABS Take 50 mg by mouth daily.      nitroGLYCERIN (NITROSTAT) 0.4 MG SL tablet Place 1 tablet (0.4 mg total) under the tongue every 5 (five) minutes x 3 doses as needed for chest pain (if no relief after 2nd dose, proceed to ED or call 911). 25 tablet 3   No current facility-administered medications for this visit.   Allergies:  Patient has no known allergies.   ROS: No orthopnea or PND.  No claudication.  Physical Exam: VS:  BP 130/62   Pulse 69   Ht '5\' 11"'$  (1.803 m)   Wt 192 lb (87.1 kg)  SpO2 98%   BMI 26.78 kg/m , BMI Body mass index is 26.78 kg/m.  Wt Readings from Last 3 Encounters:  05/27/22 192 lb (87.1 kg)  07/15/21 189 lb 12.8 oz (86.1 kg)  06/25/21 183 lb (83 kg)    General: Patient appears comfortable at rest. HEENT: Conjunctiva and lids normal. Neck: Supple, no elevated JVP or carotid bruits. Lungs: Clear to auscultation, nonlabored breathing at rest. Cardiac: Regular rate and rhythm, no S3 or significant systolic murmur, no pericardial rub. Extremities: No pitting edema.  ECG:  An ECG dated 07/09/2020 was personally reviewed today and demonstrated:  Sinus with PVCs.  Recent Labwork: 07/15/2021: ALT 23; AST 26; BUN 13; Creatinine, Ser 1.07; Hemoglobin 15.2; Platelets 247; Potassium 4.2; Sodium  142     Component Value Date/Time   CHOL 208 (H) 07/15/2021 0928   TRIG 123 07/15/2021 0928   HDL 34 (L) 07/15/2021 0928   CHOLHDL 6.1 (H) 07/15/2021 0928   LDLCALC 152 (H) 07/15/2021 0928    Other Studies Reviewed Today:  Holter monitor 05/11/2018: 48-hour Holter monitor reviewed.  Sinus rhythm was present throughout.  Heart rate ranged from 46 bpm up to 133 bpm with average heart rate 72 bpm.  There were rare to occasional PVCs noted with some ventricular bigeminy but no sustained events.  No pauses.  PVCs accounted for 1.3% of total beats.   Echocardiogram 05/11/2018: Study Conclusions   - Left ventricle: The cavity size was normal. Wall thickness was at   the upper limits of normal. Systolic function was normal. The   estimated ejection fraction was in the range of 55% to 60%. Wall   motion was normal; there were no regional wall motion   abnormalities. Left ventricular diastolic function parameters   were normal for the patient's age. - Mitral valve: There was trivial regurgitation. - Right atrium: Central venous pressure (est): 3 mm Hg. - Atrial septum: No defect or patent foramen ovale was identified. - Tricuspid valve: There was trivial regurgitation. - Pulmonary arteries: PA peak pressure: 19 mm Hg (S). - Pericardium, extracardiac: There was no pericardial effusion.   Lexiscan Myoview 05/20/2018: There was no ST segment deviation noted during stress. The study is normal. There are no perfusion defects consistent with prior infarct or current ischemia. This is a low risk study. The left ventricular ejection fraction is normal (55-65%).  Assessment and Plan:  1.  CAD with occluded nondominant RCA associated with right to left collaterals being managed medically.  He does not describe any progressive angina symptoms with typical activity.  Refill provided for fresh bottle of nitroglycerin.  Otherwise continue aspirin (he prefers to take 325 mg daily dose).  2.  Mixed  hyperlipidemia.  He declined statin therapy and other agents such as PCSK9 inhibitor.  Currently on niacin and Benefiber.  His last LDL was 152, far from goal.  3.  History of palpitations, no progressive symptoms, no dizziness or sudden syncope.  Continue with observation unless symptoms escalate.  Medication Adjustments/Labs and Tests Ordered: Current medicines are reviewed at length with the patient today.  Concerns regarding medicines are outlined above.   Tests Ordered: Orders Placed This Encounter  Procedures   EKG 12-Lead    Medication Changes: Meds ordered this encounter  Medications   nitroGLYCERIN (NITROSTAT) 0.4 MG SL tablet    Sig: Place 1 tablet (0.4 mg total) under the tongue every 5 (five) minutes x 3 doses as needed for chest pain (if no relief after 2nd dose, proceed  to ED or call 911).    Dispense:  25 tablet    Refill:  3    Disposition:  Follow up  1 year, sooner if needed.  Signed, Satira Sark, MD, [facility] 05/27/2022 8:48 AM    [facility] [place] [address], [place], [place] [address] Phone: [phone]; Fax: [phone]

## 2022-05-27 ENCOUNTER — Ambulatory Visit: Payer: Medicare Other | Attending: Cardiology | Admitting: Cardiology

## 2022-05-27 ENCOUNTER — Encounter: Payer: Self-pay | Admitting: Cardiology

## 2022-05-27 VITALS — BP 130/62 | HR 69 | Ht 71.0 in | Wt 192.0 lb

## 2022-05-27 DIAGNOSIS — I25119 Atherosclerotic heart disease of native coronary artery with unspecified angina pectoris: Secondary | ICD-10-CM

## 2022-05-27 DIAGNOSIS — E782 Mixed hyperlipidemia: Secondary | ICD-10-CM

## 2022-05-27 DIAGNOSIS — R002 Palpitations: Secondary | ICD-10-CM

## 2022-05-27 MED ORDER — NITROGLYCERIN 0.4 MG SL SUBL: 0.4000 mg | SUBLINGUAL_TABLET | SUBLINGUAL | 3 refills | Status: DC | PRN

## 2022-05-27 NOTE — Patient Instructions (Addendum)

## 2022-07-16 ENCOUNTER — Ambulatory Visit (INDEPENDENT_AMBULATORY_CARE_PROVIDER_SITE_OTHER): Payer: Medicare Other | Admitting: Family Medicine

## 2022-07-16 ENCOUNTER — Encounter: Payer: Self-pay | Admitting: Family Medicine

## 2022-07-16 VITALS — BP 153/73 | HR 62 | Temp 96.8°F | Ht 71.0 in | Wt 194.0 lb

## 2022-07-16 DIAGNOSIS — Z0001 Encounter for general adult medical examination with abnormal findings: Secondary | ICD-10-CM

## 2022-07-16 DIAGNOSIS — Z136 Encounter for screening for cardiovascular disorders: Secondary | ICD-10-CM | POA: Diagnosis not present

## 2022-07-16 DIAGNOSIS — N400 Enlarged prostate without lower urinary tract symptoms: Secondary | ICD-10-CM

## 2022-07-16 DIAGNOSIS — Z0000 Encounter for general adult medical examination without abnormal findings: Secondary | ICD-10-CM | POA: Diagnosis not present

## 2022-07-16 LAB — URINALYSIS
Bilirubin, UA: NEGATIVE
Glucose, UA: NEGATIVE
Ketones, UA: NEGATIVE
Leukocytes,UA: NEGATIVE
Nitrite, UA: NEGATIVE
Protein,UA: NEGATIVE
RBC, UA: NEGATIVE
Specific Gravity, UA: 1.015 (ref 1.005–1.030)
Urobilinogen, Ur: 0.2 mg/dL (ref 0.2–1.0)
pH, UA: 7 (ref 5.0–7.5)

## 2022-07-16 NOTE — Progress Notes (Signed)
Subjective:  Patient ID: Luke Herrera, male    DOB: 10-Oct-1945  Age: 76 y.o. MRN: [id number]  CC: Annual Exam   HPI Rony A Masterson presents for annual exam.     07/16/2022    7:57 AM 07/15/2021    8:51 AM 06/25/2021    2:36 PM  Depression screen PHQ 2/9  Decreased Interest 0 0 0  Down, Depressed, Hopeless 0 0 0  PHQ - 2 Score 0 0 0    History Cletis has a past medical history of CAD (coronary artery disease), Carpal tunnel syndrome of right wrist (05/24/2019), Diverticulitis, Diverticulosis, Fissure in ano (11/09/2011), History of [facility] spotted fever, and NSTEMI (non-ST elevated myocardial infarction) ([place]) (05/18/2015).   He has a past surgical history that includes Tonsillectomy; Hemorrhoid surgery; Flexible sigmoidoscopy (10/16/2011); Colonoscopy (N/A, 09/21/2014); Esophagogastroduodenoscopy (N/A, 09/21/2014); and Balloon dilation (N/A, 09/21/2014).   His family history includes Cancer in his maternal grandfather; Healthy in his son; Thyroid disease in his daughter.He reports that he has never smoked. He has never used smokeless tobacco. He reports that he does not drink alcohol and does not use drugs.    ROS Review of Systems  Constitutional:  Negative for activity change, fatigue and unexpected weight change.  HENT:  Negative for congestion, ear pain, hearing loss, postnasal drip and trouble swallowing.   Eyes:  Negative for pain and visual disturbance.  Respiratory:  Negative for cough, chest tightness and shortness of breath.   Cardiovascular:  Negative for chest pain, palpitations and leg swelling.  Gastrointestinal:  Negative for abdominal distention, abdominal pain, blood in stool, constipation, diarrhea, nausea and vomiting.  Endocrine: Negative for cold intolerance, heat intolerance and polydipsia.  Genitourinary:  Negative for difficulty urinating, dysuria, flank pain, frequency and urgency.  Musculoskeletal:  Negative for arthralgias and joint  swelling.  Skin:  Negative for color change, rash and wound.  Neurological:  Negative for dizziness, syncope, speech difficulty, weakness, light-headedness, numbness and headaches.  Hematological:  Does not bruise/bleed easily.  Psychiatric/Behavioral:  Negative for confusion, decreased concentration, dysphoric mood and sleep disturbance. The patient is not nervous/anxious.     Objective:  BP (!) 153/73   Pulse 62   Temp (!) 96.8 F (36 C)   Ht _0  (1.803 m)   Wt 194 lb (88 kg)   SpO2 96%   BMI 27.06 kg/m   BP Readings from Last 3 Encounters:  07/16/22 (!) 153/73  05/27/22 130/62  07/15/21 135/75    Wt Readings from Last 3 Encounters:  07/16/22 194 lb (88 kg)  05/27/22 192 lb (87.1 kg)  07/15/21 189 lb 12.8 oz (86.1 kg)     Physical Exam Constitutional:      General: He is not in acute distress.    Appearance: He is well-developed.  HENT:     Head: Normocephalic and atraumatic.     Right Ear: External ear normal.     Left Ear: External ear normal.     Nose: Nose normal.  Eyes:     Conjunctiva/sclera: Conjunctivae normal.     Pupils: Pupils are equal, round, and reactive to light.  Cardiovascular:     Rate and Rhythm: Normal rate and regular rhythm.     Heart sounds: Normal heart sounds. No murmur heard. Pulmonary:     Effort: Pulmonary effort is normal. No respiratory distress.     Breath sounds: Normal breath sounds. No wheezing or rales.  Abdominal:     Palpations: Abdomen is soft.  Tenderness: There is no abdominal tenderness.  Musculoskeletal:        General: Normal range of motion.     Cervical back: Normal range of motion and neck supple.  Skin:    General: Skin is warm and dry.  Neurological:     Mental Status: He is alert and oriented to person, place, and time.     Deep Tendon Reflexes: Reflexes are normal and symmetric.  Psychiatric:        Behavior: Behavior normal.        Thought Content: Thought content normal.        Judgment:  Judgment normal.       Assessment & Plan:   Jerrald was seen today for annual exam.  Diagnoses and all orders for this visit:  Well adult exam -     CBC with Differential/Platelet -     CMP14+EGFR -     Lipid panel -     VITAMIN D 25 Hydroxy (Vit-D Deficiency, Fractures) -     Urinalysis -     PSA, total and free  Benign prostatic hyperplasia without lower urinary tract symptoms       I am having Abdelrahman A. Giles maintain his Zinc, Benefiber Drink Mix, aspirin, NIACIN CR PO, Potassium, Turmeric, Ascorbic Acid (VITAMIN C ER PO), pyridOXINE, Vitamin D, and nitroGLYCERIN.  Allergies as of 07/16/2022   No Known Allergies      Medication List        Accurate as of July 16, 2022  8:31 AM. If you have any questions, ask your nurse or doctor.          aspirin 325 MG tablet Take 325 mg by mouth daily.   Benefiber Drink Mix Pack Take 4 g by mouth at bedtime.   NIACIN CR PO Take by mouth.   nitroGLYCERIN 0.4 MG SL tablet Commonly known as: NITROSTAT Place 1 tablet (0.4 mg total) under the tongue every 5 (five) minutes x 3 doses as needed for chest pain (if no relief after 2nd dose, proceed to ED or call 911).   Potassium 99 MG Tabs Take 1 tablet by mouth daily.   pyridOXINE 100 MG tablet Commonly known as: VITAMIN B6 Take 100 mg by mouth daily.   Turmeric 500 MG Tabs Take by mouth.   VITAMIN C ER PO Take by mouth.   Vitamin D 50 MCG (2000 UT) tablet   Zinc 100 MG Tabs Take 50 mg by mouth daily.         Follow-up: Return in about 1 year (around 07/17/2023) for [facility] physical.  Claretta Fraise, M.D.

## 2022-07-17 LAB — CMP14+EGFR
ALT: 24 IU/L (ref 0–44)
AST: 24 IU/L (ref 0–40)
Albumin/Globulin Ratio: 1.9 (ref 1.2–2.2)
Albumin: 4.6 g/dL (ref 3.8–4.8)
Alkaline Phosphatase: 81 IU/L (ref 44–121)
BUN/Creatinine Ratio: 11 (ref 10–24)
BUN: 11 mg/dL (ref 8–27)
Bilirubin Total: 0.4 mg/dL (ref 0.0–1.2)
CO2: 25 mmol/L (ref 20–29)
Calcium: 9.8 mg/dL (ref 8.6–10.2)
Chloride: 103 mmol/L (ref 96–106)
Creatinine, Ser: 1.02 mg/dL (ref 0.76–1.27)
Globulin, Total: 2.4 g/dL (ref 1.5–4.5)
Glucose: 110 mg/dL — ABNORMAL HIGH (ref 70–99)
Potassium: 4.7 mmol/L (ref 3.5–5.2)
Sodium: 140 mmol/L (ref 134–144)
Total Protein: 7 g/dL (ref 6.0–8.5)
eGFR: 76 mL/min/{1.73_m2} (ref >59)

## 2022-07-17 LAB — LIPID PANEL
Chol/HDL Ratio: 5.8 ratio — ABNORMAL HIGH (ref 0.0–5.0)
Cholesterol, Total: 219 mg/dL — ABNORMAL HIGH (ref 100–199)
HDL: 38 mg/dL — ABNORMAL LOW (ref >39)
LDL Chol Calc (NIH): 154 mg/dL — ABNORMAL HIGH (ref 0–99)
Triglycerides: 147 mg/dL (ref 0–149)
VLDL Cholesterol Cal: 27 mg/dL (ref 5–40)

## 2022-07-17 LAB — PSA, TOTAL AND FREE
PSA, Free Pct: 25.2 %
PSA, Free: 1.06 ng/mL
Prostate Specific Ag, Serum: 4.2 ng/mL — ABNORMAL HIGH (ref 0.0–4.0)

## 2022-07-17 LAB — CBC WITH DIFFERENTIAL/PLATELET
Basophils Absolute: 0.1 10*3/uL (ref 0.0–0.2)
Basos: 1 %
EOS (ABSOLUTE): 0.3 10*3/uL (ref 0.0–0.4)
Eos: 3 %
Hematocrit: 46.5 % (ref 37.5–51.0)
Hemoglobin: 15.7 g/dL (ref 13.0–17.7)
Immature Grans (Abs): 0 10*3/uL (ref 0.0–0.1)
Immature Granulocytes: 0 %
Lymphocytes Absolute: 2.3 10*3/uL (ref 0.7–3.1)
Lymphs: 26 %
MCH: 30.5 pg (ref 26.6–33.0)
MCHC: 33.8 g/dL (ref 31.5–35.7)
MCV: 90 fL (ref 79–97)
Monocytes Absolute: 0.6 10*3/uL (ref 0.1–0.9)
Monocytes: 7 %
Neutrophils Absolute: 5.5 10*3/uL (ref 1.4–7.0)
Neutrophils: 63 %
Platelets: 292 10*3/uL (ref 150–450)
RBC: 5.15 x10E6/uL (ref 4.14–5.80)
RDW: 12.2 % (ref 11.6–15.4)
WBC: 8.8 10*3/uL (ref 3.4–10.8)

## 2022-07-17 LAB — VITAMIN D 25 HYDROXY (VIT D DEFICIENCY, FRACTURES): Vit D, 25-Hydroxy: 37.2 ng/mL (ref 30.0–100.0)

## 2022-07-20 ENCOUNTER — Other Ambulatory Visit: Payer: Self-pay | Admitting: *Deleted

## 2022-07-20 DIAGNOSIS — R972 Elevated prostate specific antigen [PSA]: Secondary | ICD-10-CM

## 2022-08-24 DIAGNOSIS — M79642 Pain in left hand: Secondary | ICD-10-CM | POA: Diagnosis not present

## 2022-08-24 DIAGNOSIS — G5622 Lesion of ulnar nerve, left upper limb: Secondary | ICD-10-CM | POA: Diagnosis not present

## 2022-08-24 DIAGNOSIS — M75122 Complete rotator cuff tear or rupture of left shoulder, not specified as traumatic: Secondary | ICD-10-CM | POA: Diagnosis not present

## 2022-09-21 ENCOUNTER — Telehealth: Payer: Self-pay | Admitting: Family Medicine

## 2022-09-21 NOTE — Telephone Encounter (Signed)
Left message for patient to call back and schedule Medicare Annual Wellness Visit (AWV) to be completed by video or phone.   Last AWV: 06/25/2021   Please schedule at anytime with [facility] Nurse Health Advisor     Any questions, please contact me at [phone]   Thank you,   Stephanie Amb[facility] Clinical Support for [facility]  Care Management /[facility] You Are. We Are. [facility] ??[phone] or ??[phone]    

## 2022-09-27 NOTE — Patient Instructions (Incomplete)
[name] , Thank you for taking time to come for your Medicare Wellness Visit. I appreciate your ongoing commitment to your health goals. Please review the following plan we discussed and let me know if I can assist you in the future.   These are the goals we discussed:  Goals   None     This is a list of the screening recommended for you and due dates:  Health Maintenance  Topic Date Due   COVID-19 Vaccine (1) Never done   Medicare Annual Wellness Visit  06/25/2022   Zoster (Shingles) Vaccine (1 of 2) 10/15/2022*   Flu Shot  11/15/2022*   Pneumonia Vaccine (1 of 1 - PCV) 07/17/2023*   DTaP/Tdap/Td vaccine (2 - Td or Tdap) 03/02/2029   Hepatitis C Screening: USPSTF Recommendation to screen - Ages 38-79 yo.  Completed   HPV Vaccine  Aged Out   Colon Cancer Screening  Discontinued  *Topic was postponed. The date shown is not the original due date.    Advanced directives:   Conditions/risks identified: Aim for 30 minutes of exercise or brisk walking, 6-8 glasses of water, and 5 servings of fruits and vegetables each day.   Next appointment: Follow up in one year for your annual wellness visit.   Preventive Care 78 Years and Older, Male  Preventive care refers to lifestyle choices and visits with your health care provider that can promote health and wellness. What does preventive care include? A yearly physical exam. This is also called an annual well check. Dental exams once or twice a year. Routine eye exams. Ask your health care provider how often you should have your eyes checked. Personal lifestyle choices, including: Daily care of your teeth and gums. Regular physical activity. Eating a healthy diet. Avoiding tobacco and drug use. Limiting alcohol use. Practicing safe sex. Taking low doses of aspirin every day. Taking vitamin and mineral supplements as recommended by your health care provider. What happens during an annual well check? The services and screenings  done by your health care provider during your annual well check will depend on your age, overall health, lifestyle risk factors, and family history of disease. Counseling  Your health care provider may ask you questions about your: Alcohol use. Tobacco use. Drug use. Emotional well-being. Home and relationship well-being. Sexual activity. Eating habits. History of falls. Memory and ability to understand (cognition). Work and work Statistician. Screening  You may have the following tests or measurements: Height, weight, and BMI. Blood pressure. Lipid and cholesterol levels. These may be checked every 5 years, or more frequently if you are over 2 years old. Skin check. Lung cancer screening. You may have this screening every year starting at age 78 if you have a 30-pack-year history of smoking and currently smoke or have quit within the past 15 years. Fecal occult blood test (FOBT) of the stool. You may have this test every year starting at age 78. Flexible sigmoidoscopy or colonoscopy. You may have a sigmoidoscopy every 5 years or a colonoscopy every 10 years starting at age 78. Prostate cancer screening. Recommendations will vary depending on your family history and other risks. Hepatitis C blood test. Hepatitis B blood test. Sexually transmitted disease (STD) testing. Diabetes screening. This is done by checking your blood sugar (glucose) after you have not eaten for a while (fasting). You may have this done every 1-3 years. Abdominal aortic aneurysm (AAA) screening. You may need this if you are a current or former smoker. Osteoporosis. You  may be screened starting at age 78 if you are at high risk. Talk with your health care provider about your test results, treatment options, and if necessary, the need for more tests. Vaccines  Your health care provider may recommend certain vaccines, such as: Influenza vaccine. This is recommended every year. Tetanus, diphtheria, and acellular  pertussis (Tdap, Td) vaccine. You may need a Td booster every 10 years. Zoster vaccine. You may need this after age 78. Pneumococcal 13-valent conjugate (PCV13) vaccine. One dose is recommended after age 78. Pneumococcal polysaccharide (PPSV23) vaccine. One dose is recommended after age 78. Talk to your health care provider about which screenings and vaccines you need and how often you need them. This information is not intended to replace advice given to you by your health care provider. Make sure you discuss any questions you have with your health care provider. Document Released: 08/30/2015 Document Revised: 04/22/2016 Document Reviewed: 06/04/2015 Elsevier Interactive Patient Education  2017 [employer] Prevention in the Home Falls can cause injuries. They can happen to people of all ages. There are many things you can do to make your home safe and to help prevent falls. What can I do on the outside of my home? Regularly fix the edges of walkways and driveways and fix any cracks. Remove anything that might make you trip as you walk through a door, such as a raised step or threshold. Trim any bushes or trees on the path to your home. Use bright outdoor lighting. Clear any walking paths of anything that might make someone trip, such as rocks or tools. Regularly check to see if handrails are loose or broken. Make sure that both sides of any steps have handrails. Any raised decks and porches should have guardrails on the edges. Have any leaves, snow, or ice cleared regularly. Use sand or salt on walking paths during winter. Clean up any spills in your garage right away. This includes oil or grease spills. What can I do in the bathroom? Use night lights. Install grab bars by the toilet and in the tub and shower. Do not use towel bars as grab bars. Use non-skid mats or decals in the tub or shower. If you need to sit down in the shower, use a plastic, non-slip stool. Keep the floor  dry. Clean up any water that spills on the floor as soon as it happens. Remove soap buildup in the tub or shower regularly. Attach bath mats securely with double-sided non-slip rug tape. Do not have throw rugs and other things on the floor that can make you trip. What can I do in the bedroom? Use night lights. Make sure that you have a light by your bed that is easy to reach. Do not use any sheets or blankets that are too big for your bed. They should not hang down onto the floor. Have a firm chair that has side arms. You can use this for support while you get dressed. Do not have throw rugs and other things on the floor that can make you trip. What can I do in the kitchen? Clean up any spills right away. Avoid walking on wet floors. Keep items that you use a lot in easy-to-reach places. If you need to reach something above you, use a strong step stool that has a grab bar. Keep electrical cords out of the way. Do not use floor polish or wax that makes floors slippery. If you must use wax, use non-skid floor wax. Do  not have throw rugs and other things on the floor that can make you trip. What can I do with my stairs? Do not leave any items on the stairs. Make sure that there are handrails on both sides of the stairs and use them. Fix handrails that are broken or loose. Make sure that handrails are as long as the stairways. Check any carpeting to make sure that it is firmly attached to the stairs. Fix any carpet that is loose or worn. Avoid having throw rugs at the top or bottom of the stairs. If you do have throw rugs, attach them to the floor with carpet tape. Make sure that you have a light switch at the top of the stairs and the bottom of the stairs. If you do not have them, ask someone to add them for you. What else can I do to help prevent falls? Wear shoes that: Do not have high heels. Have rubber bottoms. Are comfortable and fit you well. Are closed at the toe. Do not wear  sandals. If you use a stepladder: Make sure that it is fully opened. Do not climb a closed stepladder. Make sure that both sides of the stepladder are locked into place. Ask someone to hold it for you, if possible. Clearly mark and make sure that you can see: Any grab bars or handrails. First and last steps. Where the edge of each step is. Use tools that help you move around (mobility aids) if they are needed. These include: Canes. Walkers. Scooters. Crutches. Turn on the lights when you go into a dark area. Replace any light bulbs as soon as they burn out. Set up your furniture so you have a clear path. Avoid moving your furniture around. If any of your floors are uneven, fix them. If there are any pets around you, be aware of where they are. Review your medicines with your doctor. Some medicines can make you feel dizzy. This can increase your chance of falling. Ask your doctor what other things that you can do to help prevent falls. This information is not intended to replace advice given to you by your health care provider. Make sure you discuss any questions you have with your health care provider. Document Released: 05/30/2009 Document Revised: 01/09/2016 Document Reviewed: 09/07/2014 Elsevier Interactive Patient Education  2017 [employer].

## 2022-09-27 NOTE — Progress Notes (Unsigned)
Subjective:   Luke Herrera is a 77 y.o. male who presents for Medicare Annual/Subsequent preventive examination.  Review of Systems    ***       Objective:    There were no vitals filed for this visit. There is no height or weight on file to calculate BMI.     06/25/2021    2:38 PM 03/03/2019    7:38 PM 05/09/2018   12:36 AM 05/02/2017    9:59 AM 04/27/2016   10:56 AM 09/21/2014    8:34 AM 10/16/2011    6:55 AM  Advanced Directives  Does Patient Have a Medical Advance Directive? Yes No Yes Yes Yes No Patient does not have advance directive;Patient would not like information  Type of Advance Directive Living will;Healthcare Power of Luke Herrera;Living will Luke Herrera;Living will Living will;Healthcare Power of Luke Herrera in Chart?    No - copy requested No - copy requested    Would patient like information on creating a medical advance directive?  No - Patient declined    No - patient declined information   Pre-existing out of facility DNR order (yellow form or pink MOST form)       No    Current Medications (verified) Outpatient Encounter Medications as of 09/28/2022  Medication Sig   Ascorbic Acid (VITAMIN C ER PO) Take by mouth.   aspirin 325 MG tablet Take 325 mg by mouth daily.   Cholecalciferol (VITAMIN D) 50 MCG (2000 UT) tablet    NIACIN CR PO Take by mouth.   nitroGLYCERIN (NITROSTAT) 0.4 MG SL tablet Place 1 tablet (0.4 mg total) under the tongue every 5 (five) minutes x 3 doses as needed for chest pain (if no relief after 2nd dose, proceed to ED or call 911).   Potassium 99 MG TABS Take 1 tablet by mouth daily.   pyridOXINE (VITAMIN B-6) 100 MG tablet Take 100 mg by mouth daily.   Turmeric 500 MG TABS Take by mouth.   Wheat Dextrin (BENEFIBER DRINK MIX) PACK Take 4 g by mouth at bedtime.   Zinc 100 MG TABS Take 50 mg by mouth daily.    No facility-administered encounter medications  on file as of 09/28/2022.    Allergies (verified) Patient has no known allergies.   History: Past Medical History:  Diagnosis Date   CAD (coronary artery disease)    Occluded nondominant RCA with right to right collaterals - 05/2015, [facility], [place]    Carpal tunnel syndrome of right wrist 05/24/2019   Diverticulitis    Diverticulosis    Fissure in ano 11/09/2011   History of [facility] spotted fever    NSTEMI (non-ST elevated myocardial infarction) ([place]) 05/18/2015   Past Surgical History:  Procedure Laterality Date   BALLOON DILATION N/A 09/21/2014   Procedure: BALLOON DILATION;  Surgeon: Rogene Houston, MD;  [facility];  Service: Endoscopy;  Laterality: N/A;   COLONOSCOPY N/A 09/21/2014   Procedure: COLONOSCOPY;  Surgeon: Rogene Houston, MD;  [facility];  Service: Endoscopy;  Laterality: N/A;  930   ESOPHAGOGASTRODUODENOSCOPY N/A 09/21/2014   Procedure: ESOPHAGOGASTRODUODENOSCOPY (EGD);  Surgeon: Rogene Houston, MD;  [facility];  Service: Endoscopy;  Laterality: N/A;   FLEXIBLE SIGMOIDOSCOPY  10/16/2011   Procedure: FLEXIBLE SIGMOIDOSCOPY;  Surgeon: Rogene Houston, MD;  [facility];  Service: Endoscopy;  Laterality: N/A;  [place]     Family History  Problem Relation Age of Onset   Healthy Son    Thyroid disease Daughter    Cancer Maternal Grandfather    Anesthesia problems Neg Hx    Hypotension Neg Hx    Malignant hyperthermia Neg Hx    Pseudochol deficiency Neg Hx    Social History   Socioeconomic History   Marital status: Married    Spouse name: Not on file   Number of children: Not on file   Years of education: Not on file   Highest education level: Not on file  Occupational History   Not on file  Tobacco Use   Smoking status: Never   Smokeless tobacco: Never  Vaping Use   Vaping Use: Never used  Substance and Sexual Activity   Alcohol use: No     Alcohol/week: 0.0 standard drinks of alcohol   Drug use: No   Sexual activity: Yes    Birth control/protection: None  Other Topics Concern   Not on file  Social History Narrative   Not on file   Social Determinants of Health   Financial Resource Strain: Not on file  Food Insecurity: No Food Insecurity (06/25/2021)   Hunger Vital Sign    Worried About Running Out of Food in the Last Year: Never true    Ran Out of Food in the Last Year: Never true  Transportation Needs: No Transportation Needs (06/25/2021)   PRAPARE - Hydrologist (Medical): No    Lack of Transportation (Non-Medical): No  Physical Activity: Sufficiently Active (06/25/2021)   Exercise Vital Sign    Days of Exercise per Week: 7 days    Minutes of Exercise per Session: 40 min  Stress: No Stress Concern Present (06/25/2021)   [place]    Feeling of Stress : Not at all  Social Connections: Luke Herrera (06/25/2021)   Social Connection and Isolation Panel [NHANES]    Frequency of Communication with Friends and Family: More than three times a week    Frequency of Social Gatherings with Friends and Family: More than three times a week    Attends Religious Services: More than 4 times per year    Active Member of [employer] or Organizations: Yes    Attends Music therapist: More than 4 times per year    Marital Status: Married    Tobacco Counseling Counseling given: Not Answered   Clinical Intake:                 Diabetic?No          Activities of Daily Living     No data to display          Patient Care Team: Claretta Fraise, MD as PCP - General (Family Medicine) Satira Sark, MD as PCP - Cardiology (Cardiology) Roseanne Kaufman, MD as Consulting Physician (Orthopedic Surgery)  Indicate any recent Medical Services you may have received from other than [facility] providers in the  past year (date may be approximate).     Assessment:   This is a routine wellness examination for Luke Herrera.  Hearing/Vision screen No results found.  Dietary issues and exercise activities discussed:     Goals Addressed   None    Depression Screen    07/16/2022    7:57 AM 07/15/2021    8:51 AM 06/25/2021    2:36  PM 07/09/2020    3:55 PM 09/28/2017    9:09 AM 09/08/2017   11:01 AM 04/27/2016   10:54 AM  PHQ 2/9 Scores  PHQ - 2 Score 0 0 0 0 0 0 0    Fall Risk    07/16/2022    7:57 AM 07/15/2021    8:51 AM 06/25/2021    2:38 PM 06/21/2021    8:18 PM 07/09/2020    3:54 PM  Fall Risk   Falls in the past year? 1 1 0 0 1  Number falls in past yr: 0 0 0  0  Injury with Fall? 1 0 0 0 0  Risk for fall due to : History of fall(s) History of fall(s) No Fall Risks  History of fall(s)  Follow up Falls evaluation completed  Falls prevention discussed  Falls evaluation completed    FALL RISK PREVENTION PERTAINING TO THE HOME:  Any stairs in or around the home? {YES/NO:21197} If so, are there any without handrails? {YES/NO:21197} Home free of loose throw rugs in walkways, pet beds, electrical cords, etc? {YES/NO:21197} Adequate lighting in your home to reduce risk of falls? {YES/NO:21197}  ASSISTIVE DEVICES UTILIZED TO PREVENT FALLS:  Life alert? {YES/NO:21197} Use of a cane, walker or w/c? {YES/NO:21197} Grab bars in the bathroom? {YES/NO:21197} Shower chair or bench in shower? {YES/NO:21197} Elevated toilet seat or a handicapped toilet? {YES/NO:21197}  TIMED UP AND GO:  Was the test performed? No . Telephonic visit   Cognitive Function:    04/27/2016   10:59 AM  MMSE - Mini Mental State Exam  Orientation to time 5  Orientation to Place 5  Registration 3  Attention/ Calculation 5  Recall 3  Language- name 2 objects 2  Language- repeat 1  Language- follow 3 step command 3  Language- read & follow direction 1  Write a sentence 1  Copy design 1  Total score 30         06/25/2021    2:40 PM  6CIT Screen  What Year? 0 points  What month? 0 points  What time? 0 points  Count back from 20 0 points  Months in reverse 0 points  Repeat phrase 2 points  Total Score 2 points    Immunizations Immunization History  Administered Date(s) Administered   Tdap 03/03/2019    TDAP status: Up to date  {Flu Vaccine status:2101806}  {Pneumococcal vaccine status:2101807}  Covid-19 vaccine status: Information provided on how to obtain vaccines.   Qualifies for Shingles Vaccine? Yes   Zostavax completed No   Shingrix Completed?: No.    Education has been provided regarding the importance of this vaccine. Patient has been advised to call insurance company to determine out of pocket expense if they have not yet received this vaccine. Advised may also receive vaccine at local pharmacy or Health Dept. Verbalized acceptance and understanding.  Screening Tests Health Maintenance  Topic Date Due   COVID-19 Vaccine (1) Never done   Medicare Annual Wellness (AWV)  06/25/2022   Zoster Vaccines- Shingrix (1 of 2) 10/15/2022 (Originally 10/16/1994)   INFLUENZA VACCINE  11/15/2022 (Originally 03/17/2022)   Pneumonia Vaccine 2+ Years old (1 of 1 - PCV) 07/17/2023 (Originally 10/15/2009)   DTaP/Tdap/Td (2 - Td or Tdap) 03/02/2029   Hepatitis C Screening  Completed   HPV VACCINES  Aged Out   COLONOSCOPY (Pts 45-83yr Insurance coverage will need to be confirmed)  Discontinued    Health Maintenance  Health Maintenance Due  Topic Date Due  COVID-19 Vaccine (1) Never done   Medicare Annual Wellness (AWV)  06/25/2022    Colorectal cancer screening: No longer required.   Lung Cancer Screening: (Low Dose CT Chest recommended if Age 33-80 years, 30 pack-year currently smoking OR have quit w/in 15years.) does not qualify.   Lung Cancer Screening Referral: n/a   Additional Screening:  Hepatitis C Screening: does qualify; Completed 01/28/16  Vision Screening:  Recommended annual ophthalmology exams for early detection of glaucoma and other disorders of the eye. Is the patient up to date with their annual eye exam?  {YES/NO:21197} Who is the provider or what is the name of the office in which the patient attends annual eye exams? *** If pt is not established with a provider, would they like to be referred to a provider to establish care? {YES/NO:21197}.   Dental Screening: Recommended annual dental exams for proper oral hygiene  Community Resource Referral / Chronic Care Management: CRR required this visit?  {YES/NO:21197}  CCM required this visit?  {YES/NO:21197}     Plan:     I have personally reviewed and noted the following in the patient's chart:   Medical and social history Use of alcohol, tobacco or illicit drugs  Current medications and supplements including opioid prescriptions. {Opioid Prescriptions:[id number]} Functional ability and status Nutritional status Physical activity Advanced directives List of other physicians Hospitalizations, surgeries, and ER visits in previous 12 months Vitals Screenings to include cognitive, depression, and falls Referrals and appointments  In addition, I have reviewed and discussed with patient certain preventive protocols, quality metrics, and best practice recommendations. A written personalized care plan for preventive services as well as general preventive health recommendations were provided to patient.     Vanetta Mulders, [place]   [id number]   Due to this being a virtual visit, the after visit summary with patients personalized plan was offered to patient via mail or my-chart. ***Patient declined at this time./ Patient would like to access on my-chart/ per request, patient was mailed a copy of AVS./ Patient preferred to pick up at office at next visit   Nurse Notes: ***

## 2022-09-28 ENCOUNTER — Ambulatory Visit (INDEPENDENT_AMBULATORY_CARE_PROVIDER_SITE_OTHER): Payer: Medicare Other

## 2022-09-28 VITALS — Ht 71.0 in | Wt 194.0 lb

## 2022-09-28 DIAGNOSIS — Z0000 Encounter for general adult medical examination without abnormal findings: Secondary | ICD-10-CM | POA: Diagnosis not present

## 2022-09-30 DIAGNOSIS — R350 Frequency of micturition: Secondary | ICD-10-CM | POA: Diagnosis not present

## 2022-10-20 ENCOUNTER — Ambulatory Visit: Payer: Medicare Other | Admitting: Urology

## 2022-10-30 DIAGNOSIS — H6091 Unspecified otitis externa, right ear: Secondary | ICD-10-CM | POA: Diagnosis not present

## 2022-12-15 DIAGNOSIS — R350 Frequency of micturition: Secondary | ICD-10-CM | POA: Diagnosis not present

## 2023-06-07 DIAGNOSIS — H2511 Age-related nuclear cataract, right eye: Secondary | ICD-10-CM | POA: Diagnosis not present

## 2023-06-16 DIAGNOSIS — R350 Frequency of micturition: Secondary | ICD-10-CM | POA: Diagnosis not present

## 2023-06-25 DIAGNOSIS — H2511 Age-related nuclear cataract, right eye: Secondary | ICD-10-CM | POA: Diagnosis not present

## 2023-07-19 ENCOUNTER — Encounter: Payer: Self-pay | Admitting: Family Medicine

## 2023-07-19 ENCOUNTER — Ambulatory Visit (INDEPENDENT_AMBULATORY_CARE_PROVIDER_SITE_OTHER): Payer: Medicare Other | Admitting: Family Medicine

## 2023-07-19 VITALS — BP 136/65 | HR 58 | Temp 97.0°F | Ht 71.0 in | Wt 195.2 lb

## 2023-07-19 DIAGNOSIS — Z0000 Encounter for general adult medical examination without abnormal findings: Secondary | ICD-10-CM | POA: Diagnosis not present

## 2023-07-19 DIAGNOSIS — Z0001 Encounter for general adult medical examination with abnormal findings: Secondary | ICD-10-CM

## 2023-07-19 DIAGNOSIS — Z136 Encounter for screening for cardiovascular disorders: Secondary | ICD-10-CM | POA: Diagnosis not present

## 2023-07-19 DIAGNOSIS — R972 Elevated prostate specific antigen [PSA]: Secondary | ICD-10-CM | POA: Diagnosis not present

## 2023-07-19 LAB — URINALYSIS
Bilirubin, UA: NEGATIVE
Glucose, UA: NEGATIVE
Ketones, UA: NEGATIVE
Leukocytes,UA: NEGATIVE
Nitrite, UA: NEGATIVE
Protein,UA: NEGATIVE
RBC, UA: NEGATIVE
Specific Gravity, UA: 1.01 (ref 1.005–1.030)
Urobilinogen, Ur: 0.2 mg/dL (ref 0.2–1.0)
pH, UA: 7 (ref 5.0–7.5)

## 2023-07-19 MED ORDER — NITROGLYCERIN 0.4 MG SL SUBL: 0.4000 mg | SUBLINGUAL_TABLET | SUBLINGUAL | 3 refills | Status: DC | PRN

## 2023-07-19 MED ORDER — TRIAMCINOLONE ACETONIDE 0.1 % EX CREA: 1.0000 | TOPICAL_CREAM | Freq: Three times a day (TID) | CUTANEOUS | 2 refills | Status: AC

## 2023-07-19 NOTE — Progress Notes (Signed)
Subjective:  Patient ID: Luke Herrera, male    DOB: 1946/01/14  Age: 77 y.o. MRN: [id number]  CC: Annual Exam   HPI Luke Herrera presents for annual exam. No complaints except some eczema in the cold part of the year affecting extremities. Responds to triamcinolone prn.      07/19/2023    7:49 AM 09/28/2022    1:21 PM 07/16/2022    7:57 AM  Depression screen PHQ 2/9  Decreased Interest 0 0 0  Down, Depressed, Hopeless 0 0 0  PHQ - 2 Score 0 0 0    History Luke Herrera has a past medical history of CAD (coronary artery disease), Carpal tunnel syndrome of right wrist (05/24/2019), Diverticulitis, Diverticulosis, Fissure in ano (11/09/2011), History of [facility] spotted fever, and NSTEMI (non-ST elevated myocardial infarction) (HCC) (05/18/2015).   Luke Herrera has a past surgical history that includes Tonsillectomy; Hemorrhoid surgery; Flexible sigmoidoscopy (10/16/2011); Colonoscopy (N/A, 09/21/2014); Esophagogastroduodenoscopy (N/A, 09/21/2014); Balloon dilation (N/A, 09/21/2014); and Cataract extraction (Right).   His family history includes Cancer in his maternal grandfather; Healthy in his son; Thyroid disease in his daughter.Luke Herrera reports that Luke Herrera has never smoked. Luke Herrera has never used smokeless tobacco. Luke Herrera reports that Luke Herrera does not drink alcohol and does not use drugs.    ROS Review of Systems  Constitutional:  Negative for activity change, fatigue and unexpected weight change.  HENT:  Negative for congestion, ear pain, hearing loss, postnasal drip and trouble swallowing.   Eyes:  Negative for pain and visual disturbance.  Respiratory:  Negative for cough, chest tightness and shortness of breath.   Cardiovascular:  Negative for chest pain, palpitations and leg swelling.  Gastrointestinal:  Negative for abdominal distention, abdominal pain, blood in stool, constipation, diarrhea, nausea and vomiting.  Endocrine: Negative for cold intolerance, heat intolerance and polydipsia.   Genitourinary:  Negative for difficulty urinating, dysuria, flank pain, frequency and urgency.  Musculoskeletal:  Negative for arthralgias and joint swelling.  Skin:  Negative for color change, rash and wound.  Neurological:  Negative for dizziness, syncope, speech difficulty, weakness, light-headedness, numbness and headaches.  Hematological:  Does not bruise/bleed easily.  Psychiatric/Behavioral:  Negative for confusion, decreased concentration, dysphoric mood and sleep disturbance. The patient is not nervous/anxious.     Objective:  BP 136/65   Pulse (!) 58   Temp (!) 97 F (36.1 C)   Ht 5\' 11"  (1.803 m)   Wt 195 lb 3.2 oz (88.5 kg)   SpO2 98%   BMI 27.22 kg/m   BP Readings from Last 3 Encounters:  07/19/23 136/65  07/16/22 (!) 153/73  05/27/22 130/62    Wt Readings from Last 3 Encounters:  07/19/23 195 lb 3.2 oz (88.5 kg)  09/28/22 194 lb (88 kg)  07/16/22 194 lb (88 kg)     Physical Exam Constitutional:      Appearance: Luke Herrera is well-developed.  HENT:     Head: Normocephalic and atraumatic.  Eyes:     Pupils: Pupils are equal, round, and reactive to light.  Neck:     Thyroid: No thyromegaly.     Trachea: No tracheal deviation.  Cardiovascular:     Rate and Rhythm: Normal rate and regular rhythm.     Heart sounds: Normal heart sounds. No murmur heard.    No friction rub. No gallop.  Pulmonary:     Breath sounds: Normal breath sounds. No wheezing or rales.  Abdominal:     General: Bowel sounds are normal. There is no distension.  Palpations: Abdomen is soft. There is no mass.     Tenderness: There is no abdominal tenderness.     Hernia: There is no hernia in the left inguinal area.  Genitourinary:    Penis: Normal.      Testes: Normal.  Musculoskeletal:        General: Normal range of motion.     Cervical back: Normal range of motion.  Lymphadenopathy:     Cervical: No cervical adenopathy.  Skin:    General: Skin is warm and dry.  Neurological:      Mental Status: Luke Herrera is alert and oriented to person, place, and time.       Assessment & Plan:   Luke Herrera was seen today for annual exam.  Diagnoses and all orders for this visit:  Well adult exam -     CBC with Differential/Platelet -     CMP14+EGFR -     Lipid panel -     Urinalysis -     PSA, total and free -     VITAMIN D 25 Hydroxy (Vit-D Deficiency, Fractures)  Elevated PSA -     PSA, total and free  Other orders -     nitroGLYCERIN (NITROSTAT) 0.4 MG SL tablet; Place 1 tablet (0.4 mg total) under the tongue every 5 (five) minutes x 3 doses as needed for chest pain (if no relief after 2nd dose, proceed to ED or call 911). -     triamcinolone cream (KENALOG) 0.1 %; Apply 1 Application topically 3 (three) times daily. Avoid face and genitalia       I am having Luke Herrera start on triamcinolone cream. I am also having him maintain his Zinc, Benefiber Drink Mix, aspirin, NIACIN CR PO, Potassium, Turmeric, Ascorbic Acid (VITAMIN C ER PO), pyridOXINE, Vitamin D, and nitroGLYCERIN.  Allergies as of 07/19/2023   No Known Allergies      Medication List        Accurate as of July 19, 2023  8:21 AM. If you have any questions, ask your nurse or doctor.          aspirin 325 MG tablet Take 325 mg by mouth daily.   Benefiber Drink Mix Pack Take 4 g by mouth at bedtime.   NIACIN CR PO Take by mouth.   nitroGLYCERIN 0.4 MG SL tablet Commonly known as: NITROSTAT Place 1 tablet (0.4 mg total) under the tongue every 5 (five) minutes x 3 doses as needed for chest pain (if no relief after 2nd dose, proceed to ED or call 911).   Potassium 99 MG Tabs Take 1 tablet by mouth daily.   pyridOXINE 100 MG tablet Commonly known as: VITAMIN B6 Take 100 mg by mouth daily.   triamcinolone cream 0.1 % Commonly known as: KENALOG Apply 1 Application topically 3 (three) times daily. Avoid face and genitalia Started by: Thanh Pomerleau   Turmeric 500 MG Tabs Take by  mouth.   VITAMIN C ER PO Take by mouth.   Vitamin D 50 MCG (2000 UT) tablet   Zinc 100 MG Tabs Take 50 mg by mouth daily.         Follow-up: Return in about 1 year (around 07/18/2024).  Mechele Claude, M.D.

## 2023-07-20 LAB — CBC WITH DIFFERENTIAL/PLATELET
Basophils Absolute: 0 10*3/uL (ref 0.0–0.2)
Basos: 1 %
EOS (ABSOLUTE): 0.3 10*3/uL (ref 0.0–0.4)
Eos: 4 %
Hematocrit: 47.9 % (ref 37.5–51.0)
Hemoglobin: 15.6 g/dL (ref 13.0–17.7)
Immature Grans (Abs): 0 10*3/uL (ref 0.0–0.1)
Immature Granulocytes: 0 %
Lymphocytes Absolute: 2 10*3/uL (ref 0.7–3.1)
Lymphs: 29 %
MCH: 30.5 pg (ref 26.6–33.0)
MCHC: 32.6 g/dL (ref 31.5–35.7)
MCV: 94 fL (ref 79–97)
Monocytes Absolute: 0.6 10*3/uL (ref 0.1–0.9)
Monocytes: 8 %
Neutrophils Absolute: 3.9 10*3/uL (ref 1.4–7.0)
Neutrophils: 58 %
Platelets: 289 10*3/uL (ref 150–450)
RBC: 5.12 x10E6/uL (ref 4.14–5.80)
RDW: 12.6 % (ref 11.6–15.4)
WBC: 6.8 10*3/uL (ref 3.4–10.8)

## 2023-07-20 LAB — LIPID PANEL
Chol/HDL Ratio: 5.5 {ratio} — ABNORMAL HIGH (ref 0.0–5.0)
Cholesterol, Total: 225 mg/dL — ABNORMAL HIGH (ref 100–199)
HDL: 41 mg/dL (ref >39)
LDL Chol Calc (NIH): 152 mg/dL — ABNORMAL HIGH (ref 0–99)
Triglycerides: 177 mg/dL — ABNORMAL HIGH (ref 0–149)
VLDL Cholesterol Cal: 32 mg/dL (ref 5–40)

## 2023-07-20 LAB — CMP14+EGFR
ALT: 20 [IU]/L (ref 0–44)
AST: 23 [IU]/L (ref 0–40)
Albumin: 4.2 g/dL (ref 3.8–4.8)
Alkaline Phosphatase: 82 [IU]/L (ref 44–121)
BUN/Creatinine Ratio: 9 — ABNORMAL LOW (ref 10–24)
BUN: 10 mg/dL (ref 8–27)
Bilirubin Total: 0.3 mg/dL (ref 0.0–1.2)
CO2: 25 mmol/L (ref 20–29)
Calcium: 9.5 mg/dL (ref 8.6–10.2)
Chloride: 103 mmol/L (ref 96–106)
Creatinine, Ser: 1.08 mg/dL (ref 0.76–1.27)
Globulin, Total: 2.6 g/dL (ref 1.5–4.5)
Glucose: 105 mg/dL — ABNORMAL HIGH (ref 70–99)
Potassium: 4.2 mmol/L (ref 3.5–5.2)
Sodium: 141 mmol/L (ref 134–144)
Total Protein: 6.8 g/dL (ref 6.0–8.5)
eGFR: 70 mL/min/{1.73_m2} (ref >59)

## 2023-07-20 LAB — VITAMIN D 25 HYDROXY (VIT D DEFICIENCY, FRACTURES): Vit D, 25-Hydroxy: 39.3 ng/mL (ref 30.0–100.0)

## 2023-07-20 LAB — PSA, TOTAL AND FREE
PSA, Free Pct: 30.6 %
PSA, Free: 1.07 ng/mL
Prostate Specific Ag, Serum: 3.5 ng/mL (ref 0.0–4.0)

## 2023-09-09 ENCOUNTER — Encounter: Payer: Self-pay | Admitting: Nurse Practitioner

## 2023-09-09 ENCOUNTER — Ambulatory Visit: Payer: Medicare Other | Attending: Nurse Practitioner | Admitting: Nurse Practitioner

## 2023-09-09 VITALS — BP 128/68 | HR 62 | Ht 71.0 in | Wt 198.6 lb

## 2023-09-09 DIAGNOSIS — I2510 Atherosclerotic heart disease of native coronary artery without angina pectoris: Secondary | ICD-10-CM

## 2023-09-09 DIAGNOSIS — E782 Mixed hyperlipidemia: Secondary | ICD-10-CM | POA: Diagnosis not present

## 2023-09-09 DIAGNOSIS — Z87898 Personal history of other specified conditions: Secondary | ICD-10-CM | POA: Diagnosis not present

## 2023-09-09 DIAGNOSIS — R0789 Other chest pain: Secondary | ICD-10-CM

## 2023-09-09 NOTE — Progress Notes (Signed)
Cardiology Office Note:  .   Date:  09/09/2023  ID:  Luke Herrera, DOB 1946-06-22, MRN [id number] PCP: Mechele Claude, MD  [facility] Providers Cardiologist:  Nona Dell, MD    History of Present Illness: Luke Herrera   Luke Herrera is a 78 y.o. male with a PMH of CAD, mixed hyperlipidemia, history of palpitations, who presents today for 1 year follow-up.  Previous cardiovascular history of occluded nondominant RCA, with right to left collaterals, has been medically managed.  Last seen by Dr. Diona Browner on May 27, 2022.  He was doing well at that time.  Very active with working outside, stated he would try to walk 3 miles each day, reported he felt like he slowed down over time but denied any change in stamina.  Today he presents for 1 year follow-up.  He states he is doing well.  Does admit to left shoulder/left upper chest pain at times, says he believes this is due to when he had a fall a few years ago.  Says he has limited range of motion with his left shoulder because of this.  He notes left upper chest pain with a intermittent ache, denies any specific triggers or exertional component, difficult to describe. Denies any shortness of breath, palpitations, syncope, presyncope, dizziness, orthopnea, PND, swelling or significant weight changes, acute bleeding, or claudication.  ROS: Negative. See HPI.   Studies Reviewed: Luke Herrera    EKG:  EKG Interpretation Date/Time:  Thursday September 09 2023 08:57:35 EST Ventricular Rate:  60 PR Interval:  228 QRS Duration:  88 QT Interval:  382 QTC Calculation: 382 R Axis:   24  Text Interpretation: Sinus rhythm with 1st degree A-V block Minimal voltage criteria for LVH, may be normal variant ( R in aVL ) Inferior infarct , age undetermined When compared with ECG of 09-May-2018 00:40, PREVIOUS ECG IS PRESENT Confirmed by Sharlene Dory [phone]) on 09/09/2023 9:12:45 AM   Lexiscan 05/2018:  There was no ST segment deviation noted during  stress. The study is normal. There are no perfusion defects consistent with prior infarct or current ischemia. This is a low risk study. The left ventricular ejection fraction is normal (55-65%).  Holter monitor 05/2018:  48-hour Holter monitor reviewed. Sinus rhythm was present throughout. Heart rate ranged from 46 bpm up to 133 bpm with average heart rate 72 bpm. There were rare to occasional PVCs noted with some ventricular bigeminy but no sustained events. No pauses. PVCs accounted for 1.3% of total beats.  Echo 04/2018:  Study Conclusions   - Left ventricle: The cavity size was normal. Wall thickness was at    the upper limits of normal. Systolic function was normal. The    estimated ejection fraction was in the range of 55% to 60%. Wall    motion was normal; there were no regional wall motion    abnormalities. Left ventricular diastolic function parameters    were normal for the patient&'s age.  - Mitral valve: There was trivial regurgitation.  - Right atrium: Central venous pressure (est): 3 mm Hg.  - Atrial septum: No defect or patent foramen ovale was identified.  - Tricuspid valve: There was trivial regurgitation.  - Pulmonary arteries: PA peak pressure: 19 mm Hg (S).  - Pericardium, extracardiac: There was no pericardial effusion.  Risk Assessment/Calculations:       The 10-year ASCVD risk score (Arnett DK, et al., 2019) is: 31.6%   Values used to calculate the score:  Age: 65 years     Sex: Male     Is Non-Hispanic African American: No     Diabetic: No     Tobacco smoker: No     Systolic Blood Pressure: 128 mmHg     Is BP treated: No     HDL Cholesterol: 41 mg/dL     Total Cholesterol: 225 mg/dL      Physical Exam:   VS:  BP 128/68   Pulse 62   Ht 5\' 11"  (1.803 m)   Wt 198 lb 9.6 oz (90.1 kg)   SpO2 98%   BMI 27.70 kg/m    Wt Readings from Last 3 Encounters:  09/09/23 198 lb 9.6 oz (90.1 kg)  07/19/23 195 lb 3.2 oz (88.5 kg)  09/28/22 194 lb (88 kg)     GEN: Well nourished, well developed in no acute distress NECK: No JVD; No carotid bruits CARDIAC: S1/S2, RRR, no murmurs, rubs, gallops RESPIRATORY:  Clear to auscultation without rales, wheezing or rhonchi  ABDOMEN: Soft, non-tender, non-distended EXTREMITIES:  No edema; No deformity   ASSESSMENT AND PLAN: .    CAD, atypical chest pain Does admit to atypical left upper chest pain near her left shoulder where he had injury few years ago.  Limited range of motion noted on exam and etiology appears to be MSK related.  EKG is negative for acute ischemic changes.  No indication for ischemic evaluation at this time.  Continue current medication regimen. Heart healthy diet and regular cardiovascular exercise encouraged.  Care and ED precautions discussed.  Mixed HLD Most recent lipid panel last month revealed LDL of 152.  History of intolerance to statin medications.  Did recommend switching to lower intensity statin/Zetia/PCSK9 inhibitors.  Patient declines.  I have provided patient with information regarding Zetia and PCSK9 inhibitors.  Will revisit this at next office visit. Heart healthy diet and regular cardiovascular exercise encouraged.  I discussed the risk of not being on cholesterol medication, and he verbalized understanding.  History of palpitations Denies any tachycardia palpitations.  Heart rate is well-controlled today in sinus rhythm.  No medication changes at this time. Heart healthy diet and regular cardiovascular exercise encouraged.    Dispo: Follow-up with me/[facility] in 6 months or sooner if anything changes.  Signed, Sharlene Dory, NP

## 2023-09-09 NOTE — Patient Instructions (Addendum)

## 2023-10-04 ENCOUNTER — Ambulatory Visit (INDEPENDENT_AMBULATORY_CARE_PROVIDER_SITE_OTHER): Payer: Medicare Other

## 2023-10-04 VITALS — Ht 71.0 in | Wt 198.0 lb

## 2023-10-04 DIAGNOSIS — Z0000 Encounter for general adult medical examination without abnormal findings: Secondary | ICD-10-CM

## 2023-10-04 NOTE — Patient Instructions (Signed)
Mr. Rapozo , Thank you for taking time to come for your Medicare Wellness Visit. I appreciate your ongoing commitment to your health goals. Please review the following plan we discussed and let me know if I can assist you in the future.   Referrals/Orders/Follow-Ups/Clinician Recommendations: Aim for 30 minutes of exercise or brisk walking, 6-8 glasses of water, and 5 servings of fruits and vegetables each day.  This is a list of the screening recommended for you and due dates:  Health Maintenance  Topic Date Due   COVID-19 Vaccine (1 - 2024-25 season) Never done   Zoster (Shingles) Vaccine (1 of 2) 10/17/2023*   Flu Shot  11/15/2023*   Pneumonia Vaccine (1 of 2 - PCV) 07/18/2024*   Medicare Annual Wellness Visit  10/03/2024   DTaP/Tdap/Td vaccine (2 - Td or Tdap) 03/02/2029   Hepatitis C Screening  Completed   HPV Vaccine  Aged Out   Colon Cancer Screening  Discontinued  *Topic was postponed. The date shown is not the original due date.    Advanced directives: (ACP Link)Information on Advanced Care Planning can be found at [facility] [place] Advance Health Care Directives Advance Health Care Directives ([email or web address]   Next Medicare Annual Wellness Visit scheduled for next year: Yes

## 2023-10-04 NOTE — Progress Notes (Signed)
Subjective:   Luke Herrera is a 78 y.o. male who presents for Medicare Annual/Subsequent preventive examination.  Visit Complete: Virtual I connected with  Jaleil A Desena on 10/04/23 by a audio enabled telemedicine application and verified that I am speaking with the correct person using two identifiers.  Patient Location: Home  Provider Location: Home Office  This patient declined Interactive audio and video telecommunications. Therefore the visit was completed with audio only.  I discussed the limitations of evaluation and management by telemedicine. The patient expressed understanding and agreed to proceed.  Vital Signs: Because this visit was a virtual/telehealth visit, some criteria may be missing or patient reported. Any vitals not documented were not able to be obtained and vitals that have been documented are patient reported.  Cardiac Risk Factors include: advanced age (>82men, >29 women);male gender     Objective:    Today's Vitals   10/04/23 1418  Weight: 198 lb (89.8 kg)  Height: 5\' 11"  (1.803 m)   Body mass index is 27.62 kg/m.     10/04/2023    2:22 PM 09/28/2022    1:20 PM 06/25/2021    2:38 PM 03/03/2019    7:38 PM 05/09/2018   12:36 AM 05/02/2017    9:59 AM 04/27/2016   10:56 AM  Advanced Directives  Does Patient Have a Medical Advance Directive? No Yes Yes No Yes Yes Yes  Type of Advance Directive  Living will;Healthcare Power of Attorney Living will;Healthcare Power of [employer] Power of [place];Living will Healthcare Power of [place];Living will Living will;Healthcare Power of Attorney  Does patient want to make changes to medical advance directive?  No - Patient declined       Copy of Healthcare Power of Attorney in Chart?  No - copy requested    No - copy requested No - copy requested  Would patient like information on creating a medical advance directive? Yes (MAU/Ambulatory/Procedural Areas - Information given)   No - Patient  declined       Current Medications (verified) Outpatient Encounter Medications as of 10/04/2023  Medication Sig   Ascorbic Acid (VITAMIN C ER PO) Take by mouth.   aspirin 325 MG tablet Take 325 mg by mouth daily.   Cholecalciferol (VITAMIN D) 50 MCG (2000 UT) tablet    NIACIN CR PO Take by mouth.   nitroGLYCERIN (NITROSTAT) 0.4 MG SL tablet Place 1 tablet (0.4 mg total) under the tongue every 5 (five) minutes x 3 doses as needed for chest pain (if no relief after 2nd dose, proceed to ED or call 911).   Potassium 99 MG TABS Take 1 tablet by mouth daily.   pyridOXINE (VITAMIN B-6) 100 MG tablet Take 100 mg by mouth daily.   triamcinolone cream (KENALOG) 0.1 % Apply 1 Application topically 3 (three) times daily. Avoid face and genitalia   Turmeric 500 MG TABS Take by mouth.   Wheat Dextrin (BENEFIBER DRINK MIX) PACK Take 4 g by mouth at bedtime.   Zinc 100 MG TABS Take 50 mg by mouth daily.    No facility-administered encounter medications on file as of 10/04/2023.    Allergies (verified) Patient has no known allergies.   History: Past Medical History:  Diagnosis Date   CAD (coronary artery disease)    Occluded nondominant RCA with right to right collaterals - 05/2015, [employer], [place]    Carpal tunnel syndrome of right wrist 05/24/2019   Diverticulitis    Diverticulosis    Fissure  in ano 11/09/2011   History of [facility] spotted fever    NSTEMI (non-ST elevated myocardial infarction) (HCC) 05/18/2015   Past Surgical History:  Procedure Laterality Date   BALLOON DILATION N/A 09/21/2014   Procedure: BALLOON DILATION;  Surgeon: Malissa Hippo, MD;  [facility];  Service: Endoscopy;  Laterality: N/A;   CATARACT EXTRACTION Right    COLONOSCOPY N/A 09/21/2014   Procedure: COLONOSCOPY;  Surgeon: Malissa Hippo, MD;  [facility];  Service: Endoscopy;  Laterality: N/A;  930   ESOPHAGOGASTRODUODENOSCOPY N/A 09/21/2014    Procedure: ESOPHAGOGASTRODUODENOSCOPY (EGD);  Surgeon: Malissa Hippo, MD;  [facility];  Service: Endoscopy;  Laterality: N/A;   FLEXIBLE SIGMOIDOSCOPY  10/16/2011   Procedure: FLEXIBLE SIGMOIDOSCOPY;  Surgeon: Malissa Hippo, MD;  [facility];  Service: Endoscopy;  Laterality: N/A;  930   HEMORRHOID SURGERY     TONSILLECTOMY     Family History  Problem Relation Age of Onset   Healthy Son    Thyroid disease Daughter    Cancer Maternal Grandfather    Anesthesia problems Neg Hx    Hypotension Neg Hx    Malignant hyperthermia Neg Hx    Pseudochol deficiency Neg Hx    Social History   Socioeconomic History   Marital status: Married    Spouse name: Not on file   Number of children: Not on file   Years of education: Not on file   Highest education level: Not on file  Occupational History   Not on file  Tobacco Use   Smoking status: Never   Smokeless tobacco: Never  Vaping Use   Vaping status: Never Used  Substance and Sexual Activity   Alcohol use: No    Alcohol/week: 0.0 standard drinks of alcohol   Drug use: No   Sexual activity: Yes    Birth control/protection: None  Other Topics Concern   Not on file  Social History Narrative   Not on file   Social Drivers of Health   Financial Resource Strain: Low Risk  (10/04/2023)   Overall Financial Resource Strain (CARDIA)    Difficulty of Paying Living Expenses: Not hard at all  Food Insecurity: No Food Insecurity (10/04/2023)   Hunger Vital Sign    Worried About Running Out of Food in the Last Year: Never true    Ran Out of Food in the Last Year: Never true  Transportation Needs: No Transportation Needs (10/04/2023)   PRAPARE - Administrator, Civil Service (Medical): No    Lack of Transportation (Non-Medical): No  Physical Activity: Sufficiently Active (10/04/2023)   Exercise Vital Sign    Days of Exercise per Week: 5 days    Minutes of Exercise per Session: 30 min  Stress: No Stress  Concern Present (10/04/2023)   [employer] of Occupational Health - Occupational Stress Questionnaire    Feeling of Stress : Not at all  Social Connections: Socially Integrated (10/04/2023)   Social Connection and Isolation Panel [NHANES]    Frequency of Communication with Friends and Family: More than three times a week    Frequency of Social Gatherings with Friends and Family: Three times a week    Attends Religious Services: More than 4 times per year    Active Member of Clubs or Organizations: Yes    Attends Banker Meetings: More than 4 times per year    Marital Status: Married    Tobacco Counseling Counseling given: Not  Answered   Clinical Intake:  Pre-visit preparation completed: Yes  Pain : No/denies pain  Diabetes: No  How often do you need to have someone help you when you read instructions, pamphlets, or other written materials from your doctor or pharmacy?: 1 - Never  Interpreter Needed?: No  Information entered by :: Kandis Fantasia LPN   Activities of Daily Living    10/04/2023    2:19 PM  In your present state of health, do you have any difficulty performing the following activities:  Hearing? 0  Vision? 0  Difficulty concentrating or making decisions? 0  Walking or climbing stairs? 0  Dressing or bathing? 0  Doing errands, shopping? 0  Preparing Food and eating ? N  Using the Toilet? N  In the past six months, have you accidently leaked urine? N  Do you have problems with loss of bowel control? N  Managing your Medications? N  Managing your Finances? N  Housekeeping or managing your Housekeeping? N    Patient Care Team: Mechele Claude, MD as PCP - General (Family Medicine) Jonelle Sidle, MD as PCP - Cardiology (Cardiology) Dominica Severin, MD as Consulting Physician (Orthopedic Surgery)  Indicate any recent Medical Services you may have received from other than [facility] providers in the past year (date may be approximate).      Assessment:   This is a routine wellness examination for [place].  Hearing/Vision screen Hearing Screening - Comments:: Denies hearing difficulties   Vision Screening - Comments:: Wears rx glasses - up to date with routine eye exams with Dr. Sherryll Burger     Goals Addressed   None   Depression Screen    10/04/2023    2:21 PM 07/19/2023    7:49 AM 09/28/2022    1:21 PM 07/16/2022    7:57 AM 07/15/2021    8:51 AM 06/25/2021    2:36 PM 07/09/2020    3:55 PM  PHQ 2/9 Scores  PHQ - 2 Score 0 0 0 0 0 0 0    Fall Risk    10/04/2023    2:22 PM 07/19/2023    7:49 AM 09/28/2022    1:13 PM 07/16/2022    7:57 AM 07/15/2021    8:51 AM  Fall Risk   Falls in the past year? 0 0 0 1 1  Number falls in past yr: 0  0 0 0  Injury with Fall? 0  0 1 0  Risk for fall due to : No Fall Risks   History of fall(s) History of fall(s)  Follow up Falls prevention discussed;Education provided;Falls evaluation completed  Falls prevention discussed;Education provided;Falls evaluation completed Falls evaluation completed     MEDICARE RISK AT HOME: Medicare Risk at Home Any stairs in or around the home?: No If so, are there any without handrails?: No Home free of loose throw rugs in walkways, pet beds, electrical cords, etc?: Yes Adequate lighting in your home to reduce risk of falls?: Yes Life alert?: No Use of a cane, walker or w/c?: No Grab bars in the bathroom?: Yes Shower chair or bench in shower?: No Elevated toilet seat or a handicapped toilet?: Yes  TIMED UP AND GO:  Was the test performed?  No    Cognitive Function:    04/27/2016   10:59 AM  MMSE - Mini Mental State Exam  Orientation to time 5  Orientation to Place 5  Registration 3  Attention/ Calculation 5  Recall 3  Language- name 2 objects 2  Language-  repeat 1  Language- follow 3 step command 3  Language- read & follow direction 1  Write a sentence 1  Copy design 1  Total score 30        10/04/2023    2:24 PM 09/28/2022     1:21 PM 06/25/2021    2:40 PM  6CIT Screen  What Year? 0 points 0 points 0 points  What month? 0 points 0 points 0 points  What time? 0 points 0 points 0 points  Count back from 20 0 points 0 points 0 points  Months in reverse 0 points 0 points 0 points  Repeat phrase 0 points 0 points 2 points  Total Score 0 points 0 points 2 points    Immunizations Immunization History  Administered Date(s) Administered   Tdap 03/03/2019    TDAP status: Up to date  Flu Vaccine status: Declined, Education has been provided regarding the importance of this vaccine but patient still declined. Advised may receive this vaccine at local pharmacy or Health Dept. Aware to provide a copy of the vaccination record if obtained from local pharmacy or Health Dept. Verbalized acceptance and understanding.  Pneumococcal vaccine status: Declined,  Education has been provided regarding the importance of this vaccine but patient still declined. Advised may receive this vaccine at local pharmacy or Health Dept. Aware to provide a copy of the vaccination record if obtained from local pharmacy or Health Dept. Verbalized acceptance and understanding.   Covid-19 vaccine status: Declined, Education has been provided regarding the importance of this vaccine but patient still declined. Advised may receive this vaccine at local pharmacy or Health Dept.or vaccine clinic. Aware to provide a copy of the vaccination record if obtained from local pharmacy or Health Dept. Verbalized acceptance and understanding.  Qualifies for Shingles Vaccine? Yes   Zostavax completed No   Shingrix Completed?: No.    Education has been provided regarding the importance of this vaccine. Patient has been advised to call insurance company to determine out of pocket expense if they have not yet received this vaccine. Advised may also receive vaccine at local pharmacy or Health Dept. Verbalized acceptance and understanding.  Screening Tests Health  Maintenance  Topic Date Due   COVID-19 Vaccine (1 - 2024-25 season) Never done   Zoster Vaccines- Shingrix (1 of 2) 10/17/2023 (Originally 10/16/1994)   INFLUENZA VACCINE  11/15/2023 (Originally 03/18/2023)   Pneumonia Vaccine 55+ Years old (1 of 2 - PCV) 07/18/2024 (Originally 10/16/1951)   Medicare Annual Wellness (AWV)  10/03/2024   DTaP/Tdap/Td (2 - Td or Tdap) 03/02/2029   Hepatitis C Screening  Completed   HPV VACCINES  Aged Out   Colonoscopy  Discontinued    Health Maintenance  Health Maintenance Due  Topic Date Due   COVID-19 Vaccine (1 - 2024-25 season) Never done    Colorectal cancer screening: No longer required.   Lung Cancer Screening: (Low Dose CT Chest recommended if Age 21-80 years, 20 pack-year currently smoking OR have quit w/in 15years.) does not qualify.   Lung Cancer Screening Referral: n/a  Additional Screening:  Hepatitis C Screening: does qualify; Completed 01/28/16  Vision Screening: Recommended annual ophthalmology exams for early detection of glaucoma and other disorders of the eye. Is the patient up to date with their annual eye exam?  Yes  Who is the provider or what is the name of the office in which the patient attends annual eye exams? Dr. Sherryll Burger  If pt is not established with a provider, would they  like to be referred to a provider to establish care? No .   Dental Screening: Recommended annual dental exams for proper oral hygiene  Community Resource Referral / Chronic Care Management: CRR required this visit?  No   CCM required this visit?  No     Plan:     I have personally reviewed and noted the following in the patient's chart:   Medical and social history Use of alcohol, tobacco or illicit drugs  Current medications and supplements including opioid prescriptions. Patient is not currently taking opioid prescriptions. Functional ability and status Nutritional status Physical activity Advanced directives List of other  physicians Hospitalizations, surgeries, and ER visits in previous 12 months Vitals Screenings to include cognitive, depression, and falls Referrals and appointments  In addition, I have reviewed and discussed with patient certain preventive protocols, quality metrics, and best practice recommendations. A written personalized care plan for preventive services as well as general preventive health recommendations were provided to patient.     Kandis Fantasia [place], [place]   1/61/0960   After Visit Summary: (MyChart) Due to this being a telephonic visit, the after visit summary with patients personalized plan was offered to patient via MyChart   Nurse Notes: No concerns at this time

## 2024-02-08 DIAGNOSIS — L570 Actinic keratosis: Secondary | ICD-10-CM | POA: Diagnosis not present

## 2024-02-08 DIAGNOSIS — L821 Other seborrheic keratosis: Secondary | ICD-10-CM | POA: Diagnosis not present

## 2024-02-08 DIAGNOSIS — L814 Other melanin hyperpigmentation: Secondary | ICD-10-CM | POA: Diagnosis not present

## 2024-02-08 DIAGNOSIS — D1801 Hemangioma of skin and subcutaneous tissue: Secondary | ICD-10-CM | POA: Diagnosis not present

## 2024-03-09 ENCOUNTER — Ambulatory Visit: Payer: Medicare Other | Attending: Nurse Practitioner | Admitting: Nurse Practitioner

## 2024-03-09 ENCOUNTER — Encounter: Payer: Self-pay | Admitting: Nurse Practitioner

## 2024-03-09 VITALS — BP 120/70 | HR 59 | Ht 71.0 in | Wt 190.6 lb

## 2024-03-09 DIAGNOSIS — Z87898 Personal history of other specified conditions: Secondary | ICD-10-CM | POA: Diagnosis not present

## 2024-03-09 DIAGNOSIS — E782 Mixed hyperlipidemia: Secondary | ICD-10-CM

## 2024-03-09 DIAGNOSIS — I2510 Atherosclerotic heart disease of native coronary artery without angina pectoris: Secondary | ICD-10-CM | POA: Diagnosis not present

## 2024-03-09 NOTE — Patient Instructions (Addendum)

## 2024-03-09 NOTE — Progress Notes (Signed)
 Cardiology Office Note:  .   Date:  03/09/2024 ID:  Luke Herrera, DOB 03/15/1946, MRN [id number] PCP: Zollie Lowers, MD  [facility] Providers Cardiologist:  Jayson Sierras, MD    History of Present Illness: Luke   [name] is a 78 y.o. male with a PMH of CAD, mixed hyperlipidemia, history of palpitations, who presents today for scheduled follow-up.  Previous cardiovascular history of occluded nondominant RCA, with right to left collaterals, has been medically managed.  Last seen by Dr. Sierras on May 27, 2022. Was doing very well at the time. Very active.   Today he presents for follow-up. Continues to do well. Denies any acute cardiac complaints or issues. Denies any chest pain, shortness of breath, palpitations, syncope, presyncope, dizziness, orthopnea, PND, swelling or significant weight changes, acute bleeding, or claudication.   ROS: Negative. See HPI.   Studies Reviewed: Luke    EKG: EKG is not ordered today.      Lexiscan  05/2018:  There was no ST segment deviation noted during stress. The study is normal. There are no perfusion defects consistent with prior infarct or current ischemia. This is a low risk study. The left ventricular ejection fraction is normal (55-65%).  Holter monitor 05/2018:  48-hour Holter monitor reviewed. Sinus rhythm was present throughout. Heart rate ranged from 46 bpm up to 133 bpm with average heart rate 72 bpm. There were rare to occasional PVCs noted with some ventricular bigeminy but no sustained events. No pauses. PVCs accounted for 1.3% of total beats.  Echo 04/2018:  Study Conclusions   - Left ventricle: The cavity size was normal. Wall thickness was at    the upper limits of normal. Systolic function was normal. The    estimated ejection fraction was in the range of 55% to 60%. Wall    motion was normal; there were no regional wall motion    abnormalities. Left ventricular diastolic function parameters    were  normal for the patient&'s age.  - Mitral valve: There was trivial regurgitation.  - Right atrium: Central venous pressure (est): 3 mm Hg.  - Atrial septum: No defect or patent foramen ovale was identified.  - Tricuspid valve: There was trivial regurgitation.  - Pulmonary arteries: PA peak pressure: 19 mm Hg (S).  - Pericardium, extracardiac: There was no pericardial effusion.      Physical Exam:   VS:  BP 120/70   Pulse (!) 59   Ht 5' 11 (1.803 m)   Wt 190 lb 9.6 oz (86.5 kg)   SpO2 99%   BMI 26.58 kg/m    Wt Readings from Last 3 Encounters:  03/09/24 190 lb 9.6 oz (86.5 kg)  10/04/23 198 lb (89.8 kg)  09/09/23 198 lb 9.6 oz (90.1 kg)    GEN: Well nourished, well developed in no acute distress NECK: No JVD; No carotid bruits CARDIAC: S1/S2, RRR, no murmurs, rubs, gallops RESPIRATORY:  Clear to auscultation without rales, wheezing or rhonchi  ABDOMEN: Soft, non-tender, non-distended EXTREMITIES:  No edema; No deformity   ASSESSMENT AND PLAN: .    CAD Stable with no anginal symptoms. No indication for ischemic evaluation.    Continue current medication regimen. Heart healthy diet and regular cardiovascular exercise encouraged.  Care and ED precautions discussed.  Mixed HLD Most recent lipid panel revealed LDL of 152.  History of intolerance to statin medications.  Did recommend switching to lower intensity statin/Zetia/PCSK9 inhibitors.  Patient continues to decline.  I have previously provided  patient with information regarding Zetia and PCSK9 inhibitors. Heart healthy diet and regular cardiovascular exercise encouraged.  I discussed the risk of not being on cholesterol medication, and he verbalized understanding.  History of palpitations Denies any tachycardia or palpitations.  Heart rate is well-controlled today.  No medication changes at this time. Heart healthy diet and regular cardiovascular exercise encouraged.    Dispo: Follow-up with MD/[facility] in 6 months or sooner if  anything changes.  Signed, Almarie Crate, NP

## 2024-06-12 DIAGNOSIS — R350 Frequency of micturition: Secondary | ICD-10-CM | POA: Diagnosis not present

## 2024-07-19 ENCOUNTER — Ambulatory Visit: Payer: Medicare Other | Admitting: Family Medicine

## 2024-07-19 ENCOUNTER — Encounter: Payer: Self-pay | Admitting: Family Medicine

## 2024-07-19 VITALS — BP 137/68 | HR 57 | Temp 98.0°F | Ht 71.0 in | Wt 190.0 lb

## 2024-07-19 DIAGNOSIS — Z0000 Encounter for general adult medical examination without abnormal findings: Secondary | ICD-10-CM

## 2024-07-19 DIAGNOSIS — E559 Vitamin D deficiency, unspecified: Secondary | ICD-10-CM

## 2024-07-19 DIAGNOSIS — K5790 Diverticulosis of intestine, part unspecified, without perforation or abscess without bleeding: Secondary | ICD-10-CM

## 2024-07-19 DIAGNOSIS — N400 Enlarged prostate without lower urinary tract symptoms: Secondary | ICD-10-CM

## 2024-07-19 DIAGNOSIS — Z789 Other specified health status: Secondary | ICD-10-CM

## 2024-07-19 DIAGNOSIS — R972 Elevated prostate specific antigen [PSA]: Secondary | ICD-10-CM

## 2024-07-19 LAB — URINALYSIS, ROUTINE W REFLEX MICROSCOPIC
Bilirubin, UA: NEGATIVE
Glucose, UA: NEGATIVE
Ketones, UA: NEGATIVE
Leukocytes,UA: NEGATIVE
Nitrite, UA: NEGATIVE
Protein,UA: NEGATIVE
RBC, UA: NEGATIVE
Specific Gravity, UA: 1.01 (ref 1.005–1.030)
Urobilinogen, Ur: 0.2 mg/dL (ref 0.2–1.0)
pH, UA: 6.5 (ref 5.0–7.5)

## 2024-07-19 NOTE — Progress Notes (Signed)
 Subjective:  Patient ID: Luke Herrera, male    DOB: 1946-03-26  Age: 78 y.o. MRN: [id number]  CC: Annual Exam   HPI  Discussed the use of AI scribe software for clinical note transcription with the patient, who gave verbal consent to proceed.  History of Present Illness Luke Herrera is a 78 year old male who presents for an annual physical exam.  He has no current symptoms of concern. No headaches, chest pain, joint pains, or upset stomach. He experiences occasional stiffness in the joints, particularly in the morning, which he manages by staying active.  He maintains an active lifestyle, engaging in activities such as walking and working around his property. He typically walks three to four miles, though not every day, and describes his daily routine as involving various chores and tasks around his home and shop.  No issues with frequent nighttime urination, stating he gets up 'sometimes twice a night' depending on fluid intake. He does not consume alcohol and has never smoked.  He is not currently taking any prescription medications.          07/19/2024    7:54 AM 10/04/2023    2:21 PM 07/19/2023    7:49 AM  Depression screen PHQ 2/9  Decreased Interest 0 0 0  Down, Depressed, Hopeless 0 0 0  PHQ - 2 Score 0 0 0  Altered sleeping 0    Tired, decreased energy 0    Change in appetite 0    Feeling bad or failure about yourself  0    Trouble concentrating 0    Moving slowly or fidgety/restless 0    Suicidal thoughts 0    PHQ-9 Score 0    Difficult doing work/chores Not difficult at all      History Luke Herrera has a past medical history of CAD (coronary artery disease), Carpal tunnel syndrome of right wrist (05/24/2019), Diverticulitis, Diverticulosis, Fissure in ano (11/09/2011), History of [facility] spotted fever, and NSTEMI (non-ST elevated myocardial infarction) (HCC) (05/18/2015).   He has a past surgical history that includes Tonsillectomy; Hemorrhoid  surgery; Flexible sigmoidoscopy (10/16/2011); Colonoscopy (N/A, 09/21/2014); Esophagogastroduodenoscopy (N/A, 09/21/2014); Balloon dilation (N/A, 09/21/2014); and Cataract extraction (Right).   His family history includes Cancer in his maternal grandfather; Healthy in his son; Thyroid  disease in his daughter.He reports that he has never smoked. He has never used smokeless tobacco. He reports that he does not drink alcohol and does not use drugs.    ROS Review of Systems  Constitutional:  Negative for activity change, fatigue and unexpected weight change.  HENT:  Negative for congestion, ear pain, hearing loss, postnasal drip and trouble swallowing.   Eyes:  Negative for pain and visual disturbance.  Respiratory:  Negative for cough, chest tightness and shortness of breath.   Cardiovascular:  Negative for chest pain, palpitations and leg swelling.  Gastrointestinal:  Negative for abdominal distention, abdominal pain, blood in stool, constipation, diarrhea, nausea and vomiting.  Endocrine: Negative for cold intolerance, heat intolerance and polydipsia.  Genitourinary:  Positive for frequency (nocturia X 1-2/night). Negative for difficulty urinating, dysuria, flank pain and urgency.  Musculoskeletal:  Negative for arthralgias and joint swelling.  Skin:  Negative for color change, rash and wound.  Neurological:  Negative for dizziness, syncope, speech difficulty, weakness, light-headedness, numbness and headaches.  Hematological:  Does not bruise/bleed easily.  Psychiatric/Behavioral:  Negative for confusion, decreased concentration, dysphoric mood and sleep disturbance. The patient is not nervous/anxious.     Objective:  BP  137/68   Pulse (!) 57   Temp 98 F (36.7 C)   Ht 5' 11 (1.803 m)   Wt 190 lb (86.2 kg)   SpO2 98%   BMI 26.50 kg/m   BP Readings from Last 3 Encounters:  07/19/24 137/68  03/09/24 120/70  09/09/23 128/68    Wt Readings from Last 3 Encounters:  07/19/24 190  lb (86.2 kg)  03/09/24 190 lb 9.6 oz (86.5 kg)  10/04/23 198 lb (89.8 kg)     Physical Exam Constitutional:      Appearance: He is well-developed.  HENT:     Head: Normocephalic and atraumatic.  Eyes:     Pupils: Pupils are equal, round, and reactive to light.  Neck:     Thyroid : No thyromegaly.     Trachea: No tracheal deviation.  Cardiovascular:     Rate and Rhythm: Normal rate and regular rhythm.     Heart sounds: Normal heart sounds. No murmur heard.    No friction rub. No gallop.  Pulmonary:     Breath sounds: Normal breath sounds. No wheezing or rales.  Abdominal:     General: Bowel sounds are normal. There is no distension.     Palpations: Abdomen is soft. There is no mass.     Tenderness: There is no abdominal tenderness.     Hernia: There is no hernia in the left inguinal area.  Genitourinary:    Penis: Normal.      Testes: Normal.  Musculoskeletal:        General: Normal range of motion.     Cervical back: Normal range of motion.  Lymphadenopathy:     Cervical: No cervical adenopathy.  Skin:    General: Skin is warm and dry.  Neurological:     Mental Status: He is alert and oriented to person, place, and time.    Physical Exam MEASUREMENTS: Height- 5'11. GENERAL: Alert, cooperative, well developed, no acute distress HEENT: Normocephalic, normal oropharynx, moist mucous membranes NECK: No cervical lymphadenopathy CHEST: Clear to auscultation bilaterally, no wheezes, rhonchi, or crackles CARDIOVASCULAR: Normal heart rate and rhythm, S1 and S2 normal without murmurs ABDOMEN: Soft, non-tender, non-distended, without organomegaly, normal bowel sounds, prominent abdominal vein GENITOURINARY: Small inguinal hernia, vasectomy scar present EXTREMITIES: No cyanosis or edema MUSCULOSKELETAL: Mild joint stiffness in the morning NEUROLOGICAL: Cranial nerves grossly intact, moves all extremities without gross motor or sensory deficit SKIN: Multiple small moles  present, skin in good condition   Assessment & Plan:  Elevated PSA -     CBC with Differential/Platelet -     Comprehensive metabolic panel with GFR  Well adult exam -     CBC with Differential/Platelet -     Comprehensive metabolic panel with GFR -     Lipid panel -     PSA, total and free -     VITAMIN D  25 Hydroxy (Vit-D Deficiency, Fractures) -     Urinalysis, Routine w reflex microscopic  Benign prostatic hyperplasia without lower urinary tract symptoms -     CBC with Differential/Platelet -     Comprehensive metabolic panel with GFR -     PSA, total and free -     Urinalysis, Routine w reflex microscopic  Diverticulosis -     CBC with Differential/Platelet -     Comprehensive metabolic panel with GFR  Statin intolerance -     CBC with Differential/Platelet -     Comprehensive metabolic panel with GFR -  Lipid panel  Vitamin D  deficiency -     VITAMIN D  25 Hydroxy (Vit-D Deficiency, Fractures)    Assessment and Plan Assessment & Plan Adult Wellness Visit   A 78 year old male is in good health, active, and asymptomatic. He engages in regular physical activity, walking 2.5 to 3 miles daily. No significant medical issues are reported. Physical examination is unremarkable except for mild arthritis and a small hernia. No new concerns or symptoms. Continue current level of physical activity and maintain a healthy diet. Schedule the next wellness visit in one year unless new issues arise.  Benign prostatic hyperplasia without lower urinary tract symptoms   He reports no lower urinary tract symptoms. Nocturia occurs twice per night but is not bothersome. No significant changes in urinary habits. Continue current management without intervention.       Follow-up: Return in about 1 year (around 07/19/2025).  Butler Der, M.D.

## 2024-07-20 ENCOUNTER — Ambulatory Visit: Payer: Self-pay | Admitting: Family Medicine

## 2024-07-20 LAB — CBC WITH DIFFERENTIAL/PLATELET
Basophils Absolute: 0.1 x10E3/uL (ref 0.0–0.2)
Basos: 1 %
EOS (ABSOLUTE): 0.3 x10E3/uL (ref 0.0–0.4)
Eos: 4 %
Hematocrit: 49.9 % (ref 37.5–51.0)
Hemoglobin: 16.4 g/dL (ref 13.0–17.7)
Immature Grans (Abs): 0 x10E3/uL (ref 0.0–0.1)
Immature Granulocytes: 0 %
Lymphocytes Absolute: 2.2 x10E3/uL (ref 0.7–3.1)
Lymphs: 30 %
MCH: 30.5 pg (ref 26.6–33.0)
MCHC: 32.9 g/dL (ref 31.5–35.7)
MCV: 93 fL (ref 79–97)
Monocytes Absolute: 0.6 x10E3/uL (ref 0.1–0.9)
Monocytes: 8 %
Neutrophils Absolute: 4.3 x10E3/uL (ref 1.4–7.0)
Neutrophils: 57 %
Platelets: 284 x10E3/uL (ref 150–450)
RBC: 5.38 x10E6/uL (ref 4.14–5.80)
RDW: 12.6 % (ref 11.6–15.4)
WBC: 7.3 x10E3/uL (ref 3.4–10.8)

## 2024-07-20 LAB — PSA, TOTAL AND FREE
PSA, Free Pct: 31.3 %
PSA, Free: 1.25 ng/mL
Prostate Specific Ag, Serum: 4 ng/mL (ref 0.0–4.0)

## 2024-07-20 LAB — COMPREHENSIVE METABOLIC PANEL WITH GFR
ALT: 22 IU/L (ref 0–44)
AST: 26 IU/L (ref 0–40)
Albumin: 4.5 g/dL (ref 3.8–4.8)
Alkaline Phosphatase: 88 IU/L (ref 47–123)
BUN/Creatinine Ratio: 12 (ref 10–24)
BUN: 15 mg/dL (ref 8–27)
Bilirubin Total: 0.4 mg/dL (ref 0.0–1.2)
CO2: 24 mmol/L (ref 20–29)
Calcium: 9.8 mg/dL (ref 8.6–10.2)
Chloride: 102 mmol/L (ref 96–106)
Creatinine, Ser: 1.26 mg/dL (ref 0.76–1.27)
Globulin, Total: 2.3 g/dL (ref 1.5–4.5)
Glucose: 105 mg/dL — ABNORMAL HIGH (ref 70–99)
Potassium: 4.5 mmol/L (ref 3.5–5.2)
Sodium: 141 mmol/L (ref 134–144)
Total Protein: 6.8 g/dL (ref 6.0–8.5)
eGFR: 58 mL/min/1.73 — ABNORMAL LOW (ref >59)

## 2024-07-20 LAB — LIPID PANEL
Chol/HDL Ratio: 5.4 ratio — ABNORMAL HIGH (ref 0.0–5.0)
Cholesterol, Total: 221 mg/dL — ABNORMAL HIGH (ref 100–199)
HDL: 41 mg/dL (ref >39)
LDL Chol Calc (NIH): 155 mg/dL — ABNORMAL HIGH (ref 0–99)
Triglycerides: 137 mg/dL (ref 0–149)
VLDL Cholesterol Cal: 25 mg/dL (ref 5–40)

## 2024-07-20 LAB — VITAMIN D 25 HYDROXY (VIT D DEFICIENCY, FRACTURES): Vit D, 25-Hydroxy: 35.1 ng/mL (ref 30.0–100.0)

## 2024-09-14 ENCOUNTER — Encounter: Payer: Self-pay | Admitting: Nurse Practitioner

## 2024-09-14 ENCOUNTER — Ambulatory Visit: Admitting: Nurse Practitioner

## 2024-09-14 VITALS — BP 130/72 | HR 68 | Ht 71.0 in | Wt 193.6 lb

## 2024-09-14 DIAGNOSIS — Z87898 Personal history of other specified conditions: Secondary | ICD-10-CM

## 2024-09-14 DIAGNOSIS — E782 Mixed hyperlipidemia: Secondary | ICD-10-CM

## 2024-09-14 DIAGNOSIS — I2510 Atherosclerotic heart disease of native coronary artery without angina pectoris: Secondary | ICD-10-CM

## 2024-09-14 MED ORDER — NITROGLYCERIN 0.4 MG SL SUBL: 0.4000 mg | SUBLINGUAL_TABLET | SUBLINGUAL | 3 refills | Status: AC | PRN

## 2024-09-14 NOTE — Progress Notes (Signed)
 " Cardiology Office Note:  .   Date: 09/14/2024 ID:  Luke Herrera, DOB 08-02-1946, MRN [id number] PCP: Zollie Lowers, MD  [facility] Providers Cardiologist:  Jayson Sierras, MD    History of Present Illness: Luke   Jermond A Herrera is a 79 y.o. male with a PMH of CAD, mixed hyperlipidemia, history of palpitations, who presents today for scheduled follow-up.  Previous cardiovascular history of occluded nondominant RCA, with right to left collaterals, has been medically managed.  Last seen by Dr. Sierras on May 27, 2022. Was doing very well at the time. Very active.   I last saw patient in July 2025. Was doing well at the time.   09/14/2024 - He is here for follow-up. Continues to do well. Denies any chest pain, shortness of breath, palpitations, syncope, presyncope, dizziness, orthopnea, PND, swelling or significant weight changes, acute bleeding, or claudication.  ROS: Negative. See HPI.   Studies Reviewed: Luke    EKG:  EKG Interpretation Date/Time:  Thursday September 14 2024 09:23:19 EST Ventricular Rate:  62 PR Interval:  222 QRS Duration:  90 QT Interval:  392 QTC Calculation: 397 R Axis:   -2  Text Interpretation: Sinus rhythm with 1st degree A-V block Nonspecific ST abnormality When compared with ECG of 09-Sep-2023 08:57, Criteria for Inferior infarct are no longer Present Nonspecific T wave abnormality has replaced inverted T waves in Inferior leads Nonspecific T wave abnormality no longer evident in Anterolateral leads Confirmed by Miriam Norris [phone]) on 09/14/2024 9:32:07 AM   Lexiscan  05/2018:  There was no ST segment deviation noted during stress. The study is normal. There are no perfusion defects consistent with prior infarct or current ischemia. This is a low risk study. The left ventricular ejection fraction is normal (55-65%).  Holter monitor 05/2018:  48-hour Holter monitor reviewed. Sinus rhythm was present throughout. Heart rate ranged from  46 bpm up to 133 bpm with average heart rate 72 bpm. There were rare to occasional PVCs noted with some ventricular bigeminy but no sustained events. No pauses. PVCs accounted for 1.3% of total beats.  Echo 04/2018:  Study Conclusions   - Left ventricle: The cavity size was normal. Wall thickness was at    the upper limits of normal. Systolic function was normal. The    estimated ejection fraction was in the range of 55% to 60%. Wall    motion was normal; there were no regional wall motion    abnormalities. Left ventricular diastolic function parameters    were normal for the patient&'s age.  - Mitral valve: There was trivial regurgitation.  - Right atrium: Central venous pressure (est): 3 mm Hg.  - Atrial septum: No defect or patent foramen ovale was identified.  - Tricuspid valve: There was trivial regurgitation.  - Pulmonary arteries: PA peak pressure: 19 mm Hg (S).  - Pericardium, extracardiac: There was no pericardial effusion.      Physical Exam:   VS:  BP 130/72   Pulse 68   Ht 5' 11 (1.803 m)   Wt 193 lb 9.6 oz (87.8 kg)   SpO2 96%   BMI 27.00 kg/m    Wt Readings from Last 3 Encounters:  09/14/24 193 lb 9.6 oz (87.8 kg)  07/19/24 190 lb (86.2 kg)  03/09/24 190 lb 9.6 oz (86.5 kg)    GEN: Well nourished, well developed in no acute distress NECK: No JVD; No carotid bruits CARDIAC: S1/S2, RRR, no murmurs, rubs, gallops RESPIRATORY:  Clear to auscultation  without rales, wheezing or rhonchi  ABDOMEN: Soft, non-tender, non-distended EXTREMITIES:  No edema; No deformity   ASSESSMENT AND PLAN: .    CAD Stable with no anginal symptoms. No indication for ischemic evaluation.  Continue current medication regimen. He has declined starting cholesterol medications multiple times today, did educate him regarding risks, he verbalized understanding - see  below. Heart healthy diet and regular cardiovascular exercise encouraged.  Care and ED precautions discussed. Will refill NTG.    Mixed HLD Most recent lipid panel revealed LDL of 155.  History of intolerance to statin medications.  I have previously provided patient with information regarding Zetia and PCSK9 inhibitors. I revisited starting medication today - pt continues to decline. Heart healthy diet and regular cardiovascular exercise encouraged.  I discussed the risk of not being on cholesterol medication, and he verbalized understanding.  History of palpitations Denies any tachycardia or palpitations.  Heart rate is well-controlled today.  No medication changes at this time. Heart healthy diet and regular cardiovascular exercise encouraged.    Dispo: Follow-up with MD/APP in 6 months or sooner if anything changes.  Signed, Almarie Crate, NP   "

## 2024-09-14 NOTE — Patient Instructions (Signed)
Medication Instructions:  Nitroglycerin refilled today Continue all other medications.     Labwork: none  Testing/Procedures: none  Follow-Up: 6 months   Any Other Special Instructions Will Be Listed Below (If Applicable).   If you need a refill on your cardiac medications before your next appointment, please call your pharmacy.  

## 2025-07-23 ENCOUNTER — Encounter: Admitting: Family Medicine
